# Patient Record
Sex: Female | Born: 1980 | Race: White | Hispanic: No | Marital: Married | Smoking: Never smoker
Health system: Southern US, Community
[De-identification: ages and names within clinical notes are randomized; demographics above are authoritative.]

## PROBLEM LIST (undated history)

## (undated) DIAGNOSIS — F909 Attention-deficit hyperactivity disorder, unspecified type: Secondary | ICD-10-CM

## (undated) HISTORY — DX: Attention-deficit hyperactivity disorder, unspecified type: F90.9

## (undated) HISTORY — PX: TONSILLECTOMY AND ADENOIDECTOMY: SUR1326

## (undated) HISTORY — PX: HERNIA REPAIR: SHX51

---

## 2009-01-06 ENCOUNTER — Encounter: Payer: Self-pay | Admitting: Obstetrics and Gynecology

## 2009-07-03 DIAGNOSIS — O1420 HELLP syndrome (HELLP), unspecified trimester: Secondary | ICD-10-CM

## 2014-04-18 DIAGNOSIS — Z7185 Encounter for immunization safety counseling: Secondary | ICD-10-CM | POA: Insufficient documentation

## 2014-04-18 DIAGNOSIS — Z659 Problem related to unspecified psychosocial circumstances: Secondary | ICD-10-CM | POA: Insufficient documentation

## 2014-04-18 DIAGNOSIS — O34219 Maternal care for unspecified type scar from previous cesarean delivery: Secondary | ICD-10-CM | POA: Insufficient documentation

## 2014-04-18 DIAGNOSIS — O99820 Streptococcus B carrier state complicating pregnancy: Secondary | ICD-10-CM | POA: Insufficient documentation

## 2014-04-18 DIAGNOSIS — IMO0001 Reserved for inherently not codable concepts without codable children: Secondary | ICD-10-CM | POA: Insufficient documentation

## 2014-04-27 DIAGNOSIS — O34219 Maternal care for unspecified type scar from previous cesarean delivery: Secondary | ICD-10-CM | POA: Insufficient documentation

## 2017-03-12 ENCOUNTER — Encounter: Payer: Self-pay | Admitting: Emergency Medicine

## 2017-03-12 ENCOUNTER — Emergency Department
Admission: EM | Admit: 2017-03-12 | Discharge: 2017-03-12 | Disposition: A | Discharge status: Home / Self Care | Payer: Medicaid Other | Attending: Emergency Medicine | Admitting: Emergency Medicine

## 2017-03-12 DIAGNOSIS — Z23 Encounter for immunization: Secondary | ICD-10-CM | POA: Diagnosis not present

## 2017-03-12 DIAGNOSIS — Y999 Unspecified external cause status: Secondary | ICD-10-CM | POA: Diagnosis not present

## 2017-03-12 DIAGNOSIS — Y929 Unspecified place or not applicable: Secondary | ICD-10-CM | POA: Insufficient documentation

## 2017-03-12 DIAGNOSIS — Y93E5 Activity, floor mopping and cleaning: Secondary | ICD-10-CM | POA: Diagnosis not present

## 2017-03-12 DIAGNOSIS — W268XXA Contact with other sharp object(s), not elsewhere classified, initial encounter: Secondary | ICD-10-CM | POA: Insufficient documentation

## 2017-03-12 DIAGNOSIS — S81811A Laceration without foreign body, right lower leg, initial encounter: Secondary | ICD-10-CM | POA: Insufficient documentation

## 2017-03-12 MED ORDER — TETANUS-DIPHTH-ACELL PERTUSSIS 5-2.5-18.5 LF-MCG/0.5 IM SUSP
0.5000 mL | Freq: Once | INTRAMUSCULAR | Status: AC
  Administered 2017-03-12: 0.5 mL via INTRAMUSCULAR
  Filled 2017-03-12: qty 0.5

## 2017-03-12 NOTE — ED Provider Notes (Signed)
[facility] Emergency Department Provider Note  ____________________________________________  Time seen: Approximately 4:18 PM  I have reviewed the triage vital signs and the nursing notes.   HISTORY  Chief Complaint Laceration    HPI Marilyn Norris is a 36 y.o. female who presents emergency department complaining of a laceration to her left leg. Patient reports that she was taking out a bag of garbage when a broken piece of pottery cut her right leg. The patient was able control bleeding with immediate direct pressure. She is unsure of her last tetanus shot. No other injury or complaint.   History reviewed. No pertinent past medical history.  There are no active problems to display for this patient.   Past Surgical History:  Procedure Laterality Date  . CESAREAN SECTION    . HERNIA REPAIR      Prior to Admission medications   Not on File    Allergies Morphine and related  No family history on file.  Social History Social History  Substance Use Topics  . Smoking status: Never Smoker  . Smokeless tobacco: Not on file  . Alcohol use Not on file     Review of Systems  Constitutional: No fever/chills Cardiovascular: no chest pain. Respiratory: no cough. No SOB. Musculoskeletal: Negative for musculoskeletal pain. Skin: Positive for laceration to the right shin. Neurological: Negative for headaches, focal weakness or numbness. 10-point ROS otherwise negative.  ____________________________________________   PHYSICAL EXAM:  VITAL SIGNS: ED Triage Vitals  Enc Vitals Group     BP 03/12/17 1606 113/69     Pulse Rate 03/12/17 1606 97     Resp 03/12/17 1606 18     Temp 03/12/17 1606 97.9 F (36.6 C)     Temp Source 03/12/17 1606 Oral     SpO2 03/12/17 1606 99 %     Weight 03/12/17 1608 150 lb (68 kg)     Height 03/12/17 1608 5\' 4"  (1.626 m)     Head Circumference --      Peak Flow --      Pain Score 03/12/17 1605 2     Pain Loc  --      Pain Edu? --      Excl. in [place]? --      Constitutional: Alert and oriented. Well appearing and in no acute distress. Eyes: Conjunctivae are normal. PERRL. EOMI. Head: Atraumatic. Neck: No stridor.    Cardiovascular: Normal rate, regular rhythm. Normal S1 and S2.  Good peripheral circulation. Respiratory: Normal respiratory effort without tachypnea or retractions. Lungs CTAB. Good air entry to the bases with no decreased or absent breath sounds. Musculoskeletal: Full range of motion to all extremities. No gross deformities appreciated. Neurologic:  Normal speech and language. No gross focal neurologic deficits are appreciated.  Skin:  Skin is warm, dry and intact. No rash noted. 8 cm laceration horizontally across to right lower leg. Laceration just through the epidermal layer. Edges are smooth in nature. No foreign body. No bleeding. Psychiatric: Mood and affect are normal. Speech and behavior are normal. Patient exhibits appropriate insight and judgement.   ____________________________________________   LABS (all labs ordered are listed, but only abnormal results are displayed)  Labs Reviewed - No data to display ____________________________________________  EKG   ____________________________________________  RADIOLOGY   No results found.  ____________________________________________    PROCEDURES  Procedure(s) performed:    Marland KitchenMarland KitchenLaceration Repair Date/Time: 03/12/2017 4:46 PM Performed by: Betha Loa D Authorized by: Betha Loa D   Consent:  Consent obtained:  Verbal   Consent given by:  Patient   Risks discussed:  Pain Anesthesia (see MAR for exact dosages):    Anesthesia method:  None Laceration details:    Location:  Leg   Leg location:  R lower leg   Length (cm):  8   Depth (mm):  2 Repair type:    Repair type:  Simple Exploration:    Hemostasis achieved with:  Direct pressure   Wound exploration: entire depth of wound  probed and visualized     Wound extent: no foreign bodies/material noted     Contaminated: no   Treatment:    Area cleansed with:  Shur-Clens   Amount of cleaning:  Standard Skin repair:    Repair method:  Steri-Strips and tissue adhesive   Number of Steri-Strips:  9 Approximation:    Approximation:  Close Post-procedure details:    Dressing:  Open (no dressing)   Patient tolerance of procedure:  Tolerated well, no immediate complications Comments:     Laceration was well approximated, sealed with Dermabond. Steri-Strips are placed over the laceration for further stabilization.      Medications  Tdap (BOOSTRIX) injection 0.5 mL (0.5 mLs Intramuscular Given 03/12/17 1644)     ____________________________________________   INITIAL IMPRESSION / ASSESSMENT AND PLAN / ED COURSE  Pertinent labs & imaging results that were available during my care of the patient were reviewed by me and considered in my medical decision making (see chart for details).  Review of the [place] CSRS was performed in accordance of the [place] prior to dispensing any controlled drugs.     Patient's diagnosis is consistent with leg laceration. This is closed as described above. No complications. Patient is given wound care structures. No prescriptions at this time. Patient will follow up with primary care as needed..  Patient is given ED precautions to return to the ED for any worsening or new symptoms.     ____________________________________________  FINAL CLINICAL IMPRESSION(S) / ED DIAGNOSES  Final diagnoses:  Laceration of right lower extremity, initial encounter      NEW MEDICATIONS STARTED DURING THIS VISIT:  New Prescriptions   No medications on file        This chart was dictated using voice recognition software/Dragon. Despite best efforts to proofread, errors can occur which can change the meaning. Any change was purely unintentional.    Darletta Moll, PA-C 03/12/17  1648    Carrie Mew, MD 03/14/17 [phone]

## 2017-03-12 NOTE — ED Notes (Signed)
Wound glued and steri strips placed by PA with RN assistance.

## 2017-03-12 NOTE — ED Triage Notes (Signed)
States was picking up a bag of garbage and something cut her. Laceration across R shin noted with no bleeding currently, gauze applied.

## 2017-09-12 ENCOUNTER — Ambulatory Visit (INDEPENDENT_AMBULATORY_CARE_PROVIDER_SITE_OTHER): Payer: BC Managed Care – PPO | Admitting: Obstetrics and Gynecology

## 2017-09-12 ENCOUNTER — Encounter: Payer: Self-pay | Admitting: Obstetrics and Gynecology

## 2017-09-12 VITALS — BP 110/70 | HR 100 | Ht 64.0 in | Wt 157.0 lb

## 2017-09-12 DIAGNOSIS — Z1339 Encounter for screening examination for other mental health and behavioral disorders: Secondary | ICD-10-CM

## 2017-09-12 DIAGNOSIS — Z1331 Encounter for screening for depression: Secondary | ICD-10-CM | POA: Diagnosis not present

## 2017-09-12 DIAGNOSIS — Z01419 Encounter for gynecological examination (general) (routine) without abnormal findings: Secondary | ICD-10-CM | POA: Diagnosis not present

## 2017-09-12 DIAGNOSIS — Z1329 Encounter for screening for other suspected endocrine disorder: Secondary | ICD-10-CM

## 2017-09-12 DIAGNOSIS — Z124 Encounter for screening for malignant neoplasm of cervix: Secondary | ICD-10-CM | POA: Diagnosis not present

## 2017-09-12 DIAGNOSIS — Z30430 Encounter for insertion of intrauterine contraceptive device: Secondary | ICD-10-CM | POA: Diagnosis not present

## 2017-09-12 MED ORDER — LEVONORGESTREL 19.5 MG IU IUD: 1.0000 | INTRAUTERINE_SYSTEM | Freq: Once | INTRAUTERINE | 0 refills | Status: AC

## 2017-09-12 NOTE — Progress Notes (Signed)
Gynecology Annual Exam   PCP: Patient, No Pcp Per  Chief Complaint  Patient presents with  . Gynecologic Exam   History of Present Illness:  Ms. Marilyn Norris is a 36 y.o. [phone] who LMP was Patient's last menstrual period was 09/03/2017., presents today for her annual examination.  Her menses are regular every 28-30 days, lasting 7 day(s).  Dysmenorrhea mild, occurring first 1-2 days of flow. She does not have intermenstrual bleeding.  She is single partner, contraception - condoms most of the time.  Last Pap: unknown Hx of STDs: none  Last mammogram: n/a There is no FH of breast cancer. There is no FH of ovarian cancer. The patient does not do self-breast exams.  Tobacco use: The patient denies current or previous tobacco use. Alcohol use: social drinker Exercise: not active  The patient wears seatbelts: yes.      Patient is a 36 y.o. [id number] presenting for contraception consult.  She is currently on none and desiring to start IUD.  She has a past medical history significant for no contraindications to estrogen/progesterone contraception.  She specifically denies a history of migraine with aura, chronic hypertension, history of DVT/PE and smoking.  Reported Patient's last menstrual period was 09/03/2017.Marland Kitchen     Past Medical History: Denies  Past Surgical History:  Procedure Laterality Date  . CESAREAN SECTION    . HERNIA REPAIR      Medications   Denies    Allergies  Allergen Reactions  . Morphine And Related Itching    Gynecologic History: Patient's last menstrual period was 09/03/2017.  Obstetric History: [id number]  Social History   Socioeconomic History  . Marital status: Legally Separated    Spouse name: Not on file  . Number of children: Not on file  . Years of education: Not on file  . Highest education level: Not on file  Social Needs  . Financial resource strain: Not on file  . Food insecurity - worry: Not on file  . Food insecurity -  inability: Not on file  . Transportation needs - medical: Not on file  . Transportation needs - non-medical: Not on file  Occupational History  . Not on file  Tobacco Use  . Smoking status: Never Smoker  . Smokeless tobacco: Never Used  Substance and Sexual Activity  . Alcohol use: Yes  . Drug use: No  . Sexual activity: Yes    Birth control/protection: None  Other Topics Concern  . Not on file  Social History Narrative  . Not on file    Family History  Problem Relation Age of Onset  . Thyroid disease Mother   . Cancer Father   . Thyroid disease Sister     Review of Systems  Constitutional: Negative.   HENT: Negative.   Eyes: Negative.   Respiratory: Negative.   Cardiovascular: Negative.   Gastrointestinal: Negative.   Genitourinary: Negative.   Musculoskeletal: Negative.   Skin: Negative.   Neurological: Negative.   Psychiatric/Behavioral: Negative.      Physical Exam BP 110/70   Pulse 100   Ht 5\' 4"  (1.626 m)   Wt 157 lb (71.2 kg)   LMP 09/03/2017   BMI 26.95 kg/m    Physical Exam  Constitutional: She is oriented to person, place, and time. She appears well-developed and well-nourished. No distress.  Genitourinary: Uterus normal. Pelvic exam was performed with patient supine. There is no rash, tenderness, lesion or injury on the right labia. There is no rash, tenderness,  lesion or injury on the left labia. No erythema, tenderness or bleeding in the vagina. No signs of injury around the vagina. No vaginal discharge found. Right adnexum does not display mass, does not display tenderness and does not display fullness. Left adnexum does not display mass, does not display tenderness and does not display fullness. Cervix does not exhibit motion tenderness, lesion, discharge or polyp.   Uterus is mobile and anteverted. Uterus is not enlarged, tender or exhibiting a mass.  HENT:  Head: Normocephalic and atraumatic.  Eyes: EOM are normal. No scleral icterus.  Neck:  Normal range of motion. Neck supple. No thyromegaly present.  Cardiovascular: Normal rate and regular rhythm. Exam reveals no gallop and no friction rub.  No murmur heard. Pulmonary/Chest: Effort normal and breath sounds normal. No respiratory distress. She has no wheezes. She has no rales. Right breast exhibits no inverted nipple, no mass, no nipple discharge, no skin change and no tenderness. Left breast exhibits no inverted nipple, no mass, no nipple discharge, no skin change and no tenderness.  Abdominal: Soft. Bowel sounds are normal. She exhibits no distension and no mass. There is no tenderness. There is no rebound and no guarding.  Musculoskeletal: Normal range of motion. She exhibits no edema or tenderness.  Lymphadenopathy:    She has no cervical adenopathy.       Right: No inguinal adenopathy present.       Left: No inguinal adenopathy present.  Neurological: She is alert and oriented to person, place, and time. No cranial nerve deficit.  Skin: Skin is warm and dry. No rash noted. No erythema.  Psychiatric: She has a normal mood and affect. Her behavior is normal. Judgment normal.   IUD Insertion Procedure Note Marilyn Norris) Patient identified, informed consent performed, consent signed.   Discussed risks of irregular bleeding, cramping, infection, malpositioning, expulsion or uterine perforation of the IUD (1:1000 placements)  which may require further procedure such as laparoscopy.  IUD while effective at preventing pregnancy do not prevent transmission of sexually transmitted diseases and use of barrier methods for this purpose was discussed. Time out was performed.  Urine pregnancy test negative.  Speculum placed in the vagina.  Cervix visualized.  Cleaned with Betadine x 2.  Grasped anteriorly with a single tooth tenaculum.  Uterus sounded to 8 cm. IUD placed per manufacturer's recommendations.  Strings trimmed to 3 cm. Tenaculum was removed, good hemostasis noted.  Patient tolerated  procedure well.   Patient was given post-procedure instructions.  She was advised to have backup contraception for one week.    Female chaperone present for pelvic and breast  portions of the physical exam  Results: AUDIT Questionnaire (screen for alcoholism): 3 PHQ-9: 0  Assessment: 36 y.o. G44P3003 female here for routine annual gynecologic examination.  Plan: Problem List Items Addressed This Visit    None    Visit Diagnoses    Women's annual routine gynecological examination    -  Primary   Relevant Orders   TSH + free T4   IGP, Aptima HPV, rfx 16/18,45   Screening for depression       Screening for alcoholism       Pap smear for cervical cancer screening       Relevant Orders   IGP, Aptima HPV, rfx 16/18,45   Encounter for insertion of intrauterine contraceptive device (IUD)       Relevant Medications   Levonorgestrel (KYLEENA) 19.5 MG IUD   Screening for thyroid disorder  Relevant Orders   TSH + free T4      Screening: -- Blood pressure screen normal -- Colonoscopy - not due -- Mammogram - not due -- Weight screening: overweight: continue to monitor -- Depression screening negative (PHQ-9) -- Nutrition: normal -- cholesterol screening: not due for screening -- osteoporosis screening: not due -- tobacco screening: not using -- alcohol screening: AUDIT questionnaire indicates low-risk usage. -- family history of breast cancer screening: done. not at high risk. -- no evidence of domestic violence or intimate partner violence. -- STD screening: gonorrhea/chlamydia NAAT not collected per patient request. -- pap smear collected per ASCCP guidelines -- HPV vaccination series: not eligilbe    Prentice Docker, MD 09/12/2017 11:02 AM

## 2017-09-12 NOTE — Patient Instructions (Signed)

## 2017-09-13 LAB — TSH+FREE T4
Free T4: 1.46 ng/dL (ref 0.82–1.77)
TSH: 1.07 u[IU]/mL (ref 0.450–4.500)

## 2017-09-16 LAB — IGP, APTIMA HPV, RFX 16/18,45
HPV APTIMA: NEGATIVE
PAP SMEAR COMMENT: 0

## 2017-10-11 ENCOUNTER — Ambulatory Visit: Payer: BC Managed Care – PPO | Admitting: Obstetrics and Gynecology

## 2018-04-12 DIAGNOSIS — D239 Other benign neoplasm of skin, unspecified: Secondary | ICD-10-CM

## 2018-04-12 HISTORY — DX: Other benign neoplasm of skin, unspecified: D23.9

## 2018-10-13 ENCOUNTER — Encounter: Payer: Self-pay | Admitting: Obstetrics and Gynecology

## 2018-10-13 ENCOUNTER — Ambulatory Visit (INDEPENDENT_AMBULATORY_CARE_PROVIDER_SITE_OTHER): Payer: BC Managed Care – PPO | Admitting: Obstetrics and Gynecology

## 2018-10-13 VITALS — BP 100/50 | HR 82 | Ht 64.0 in | Wt 164.0 lb

## 2018-10-13 DIAGNOSIS — F900 Attention-deficit hyperactivity disorder, predominantly inattentive type: Secondary | ICD-10-CM

## 2018-10-13 DIAGNOSIS — Z01419 Encounter for gynecological examination (general) (routine) without abnormal findings: Secondary | ICD-10-CM | POA: Diagnosis not present

## 2018-10-13 DIAGNOSIS — Z1329 Encounter for screening for other suspected endocrine disorder: Secondary | ICD-10-CM

## 2018-10-13 DIAGNOSIS — Z130 Encounter for screening for diseases of the blood and blood-forming organs and certain disorders involving the immune mechanism: Secondary | ICD-10-CM

## 2018-10-13 DIAGNOSIS — N393 Stress incontinence (female) (male): Secondary | ICD-10-CM

## 2018-10-13 DIAGNOSIS — Z0000 Encounter for general adult medical examination without abnormal findings: Secondary | ICD-10-CM

## 2018-10-13 DIAGNOSIS — Z131 Encounter for screening for diabetes mellitus: Secondary | ICD-10-CM

## 2018-10-13 DIAGNOSIS — Z13220 Encounter for screening for lipoid disorders: Secondary | ICD-10-CM

## 2018-10-13 NOTE — Progress Notes (Signed)
Gynecology Annual Exam   PCP: Patient, No Pcp Per  Chief Complaint:  Chief Complaint  Patient presents with  . Gynecologic Exam    Urinary incontance, and ADHD    History of Present Illness: Patient is a 38 y.o. G3P3003 presents for annual exam. The patient has no complaints today.   LMP: No LMP recorded. (Menstrual status: IUD). Average Interval: regular, 28 days Duration of flow: a few days Heavy Menses: no Clots: no Intermenstrual Bleeding: no Postcoital Bleeding: no Dysmenorrhea: no  The patient is sexually active. She currently uses IUD for contraception. She denies dyspareunia.  The patient does perform self breast exams.  There is no notable family history of breast or ovarian cancer in her family.  The patient wears seatbelts: yes.   The patient has regular exercise: yes.    The patient denies current symptoms of depression.    Review of Systems: Review of Systems  Constitutional: Negative for chills, fever, malaise/fatigue and weight loss.  HENT: Negative for congestion, hearing loss and sinus pain.   Eyes: Negative for blurred vision and double vision.  Respiratory: Negative for cough, sputum production, shortness of breath and wheezing.   Cardiovascular: Negative for chest pain, palpitations, orthopnea and leg swelling.  Gastrointestinal: Negative for abdominal pain, constipation, diarrhea, nausea and vomiting.  Genitourinary: Negative for dysuria, flank pain, frequency, hematuria and urgency.  Musculoskeletal: Negative for back pain, falls and joint pain.  Skin: Negative for itching and rash.  Neurological: Negative for dizziness and headaches.  Psychiatric/Behavioral: Negative for depression, substance abuse and suicidal ideas. The patient is not nervous/anxious.     Past Medical History:  Past Medical History:  Diagnosis Date  . Adult ADHD    ADD    Past Surgical History:  Past Surgical History:  Procedure Laterality Date  . CESAREAN SECTION   07/03/2009  . HERNIA REPAIR    . TONSILLECTOMY AND ADENOIDECTOMY     17-73 years old     Gynecologic History:  No LMP recorded. (Menstrual status: IUD). Contraception: IUD Last Pap: Results were: NIL and HR HPV negative   Obstetric History: [id number]  Family History:  Family History  Problem Relation Age of Onset  . Thyroid disease Mother   . Cancer Father        Kidney   . Thyroid disease Sister     Social History:  Social History   Socioeconomic History  . Marital status: Divorced    Spouse name: Not on file  . Number of children: Not on file  . Years of education: Not on file  . Highest education level: Not on file  Occupational History  . Not on file  Social Needs  . Financial resource strain: Not on file  . Food insecurity:    Worry: Not on file    Inability: Not on file  . Transportation needs:    Medical: Not on file    Non-medical: Not on file  Tobacco Use  . Smoking status: Never Smoker  . Smokeless tobacco: Never Used  Substance and Sexual Activity  . Alcohol use: Yes  . Drug use: No  . Sexual activity: Yes    Birth control/protection: I.U.D.  Lifestyle  . Physical activity:    Days per week: Not on file    Minutes per session: Not on file  . Stress: Not on file  Relationships  . Social connections:    Talks on phone: Not on file    Gets together: Not on  file    Attends religious service: Not on file    Active member of club or organization: Not on file    Attends meetings of clubs or organizations: Not on file    Relationship status: Not on file  . Intimate partner violence:    Fear of current or ex partner: Not on file    Emotionally abused: Not on file    Physically abused: Not on file    Forced sexual activity: Not on file  Other Topics Concern  . Not on file  Social History Narrative  . Not on file    Allergies:  Allergies  Allergen Reactions  . Morphine And Related Itching    Medications: Prior to Admission medications     Medication Sig Start Date End Date Taking? Authorizing Provider  Levonorgestrel (KYLEENA) 19.5 MG IUD 1 Device by Intrauterine route once for 1 dose. 09/12/17 09/12/17  Will Bonnet, MD    Physical Exam Vitals: Blood pressure (!) 100/50, pulse 82, height 5\' 4"  (1.626 m), weight 164 lb (74.4 kg).  General: NAD HEENT: normocephalic, anicteric Thyroid: no enlargement, no palpable nodules Pulmonary: No increased work of breathing, CTAB Cardiovascular: RRR, distal pulses 2+ Breast: Breast symmetrical, no tenderness, no palpable nodules or masses, no skin or nipple retraction present, no nipple discharge.  No axillary or supraclavicular lymphadenopathy. Abdomen: NABS, soft, non-tender, non-distended.  Umbilicus without lesions.  No hepatomegaly, splenomegaly or masses palpable. No evidence of hernia  Genitourinary:  External: Normal external female genitalia.  Normal urethral meatus, normal Bartholin's and Skene's glands.    Vagina: Normal vaginal mucosa, no evidence of prolapse.    Cervix: Grossly normal in appearance, no bleeding- IUD strings seen.  Uterus: Non-enlarged, mobile, normal contour.  No CMT  Adnexa: ovaries non-enlarged, no adnexal masses  Rectal: deferred  Lymphatic: no evidence of inguinal lymphadenopathy Extremities: no edema, erythema, or tenderness Neurologic: Grossly intact Psychiatric: mood appropriate, affect full  Female chaperone present for pelvic and breast  portions of the physical exam    Assessment: 38 y.o. G3P3003 routine annual exam  Plan: Problem List Items Addressed This Visit    None    Visit Diagnoses    Health care maintenance    -  Primary   Relevant Orders   Lipid panel   CBC   Basic Metabolic Panel (BMET)   TSH   CBC   TSH   Lipid panel   Basic Metabolic Panel (BMET)   Thyroid disorder screening       Relevant Orders   TSH   TSH   Screening for diabetes mellitus       Relevant Orders   Basic Metabolic Panel (BMET)   Basic  Metabolic Panel (BMET)   Screening cholesterol level       Relevant Orders   Lipid panel   Lipid panel   Screening, anemia, deficiency, iron       Relevant Orders   CBC   CBC   Attention deficit hyperactivity disorder (ADHD), predominantly inattentive type       Relevant Orders   Ambulatory referral to Psychiatry      2) STI screening  was  offered and accepted  2)  ASCCP guidelines and rational discussed.  Patient opts for every 5 years screening interval  3) Contraception - the patient is currently using  IUD.  She is happy with her current form of contraception and plans to continue  4) Routine healthcare maintenance including cholesterol, diabetes screening discussed To return  fasting at a later date  5) ADD- reports that she was prescribed a medication for her for this after she had testing. She does not know the name. Referral made to psychiatry for prescription maintenance   6) Stress incontinence- patient reports significant cough laugh sneeze leakage of urine. She has to wear a tampon when she jogs to prevent leakage of urine. She usually wears dark clothes to prevent visible accidents. Has to change her underwear on a daily basis because of an accident.  We discussed options for treatment including surgery, pelvic floor physical therapy, incontinence tampons and pessary with a knob.   7) Return in about 1 year (around 10/14/2019) for annual.  More than 45 minutes were spent face to face with the patient in the room with more than 50% of the time spent providing counseling and discussing the plan of management. We discussed management of her ADD, referrals to appropriate providers, stress incontinence care and management.    Adrian Prows MD [facility] 10/13/2018 5:09 PM

## 2018-10-16 ENCOUNTER — Ambulatory Visit (INDEPENDENT_AMBULATORY_CARE_PROVIDER_SITE_OTHER): Payer: BC Managed Care – PPO | Admitting: Maternal Newborn

## 2018-10-16 ENCOUNTER — Encounter: Payer: Self-pay | Admitting: Maternal Newborn

## 2018-10-16 VITALS — BP 100/62 | HR 75 | Ht 64.0 in | Wt 163.0 lb

## 2018-10-16 DIAGNOSIS — R82998 Other abnormal findings in urine: Secondary | ICD-10-CM | POA: Diagnosis not present

## 2018-10-16 DIAGNOSIS — R309 Painful micturition, unspecified: Secondary | ICD-10-CM | POA: Diagnosis not present

## 2018-10-16 DIAGNOSIS — N390 Urinary tract infection, site not specified: Secondary | ICD-10-CM | POA: Diagnosis not present

## 2018-10-16 DIAGNOSIS — R300 Dysuria: Secondary | ICD-10-CM

## 2018-10-16 DIAGNOSIS — R319 Hematuria, unspecified: Secondary | ICD-10-CM

## 2018-10-16 LAB — POCT URINALYSIS DIPSTICK
BILIRUBIN UA: NEGATIVE
Glucose, UA: NEGATIVE
Ketones, UA: NEGATIVE
Nitrite, UA: NEGATIVE
Protein, UA: NEGATIVE
Spec Grav, UA: 1.015 (ref 1.010–1.025)
Urobilinogen, UA: 0.2 E.U./dL
pH, UA: 6 (ref 5.0–8.0)

## 2018-10-16 MED ORDER — NITROFURANTOIN MONOHYD MACRO 100 MG PO CAPS: 100.0000 mg | ORAL_CAPSULE | Freq: Two times a day (BID) | ORAL | 1 refills | Status: DC

## 2018-10-16 NOTE — Progress Notes (Signed)
Obstetrics & Gynecology Office Visit   Chief Complaint:  Chief Complaint  Patient presents with  . Urinary Tract Infection    Burning with urination, sharp pain with urination, frequency     History of Present Illness: Marilyn Norris reports very painful dysuria starting yesterday evening. She drank a lot of water overnight and felt a small improvement in symptoms. Has not been able to drink as many fluids today, and dysuria continues along with frequency.  Review of Systems: Review of systems negative unless otherwise noted in HPI.  Past Medical History:  Past Medical History:  Diagnosis Date  . Adult ADHD    ADD    Past Surgical History:  Past Surgical History:  Procedure Laterality Date  . CESAREAN SECTION  07/03/2009  . HERNIA REPAIR    . TONSILLECTOMY AND ADENOIDECTOMY     37-40 years old     Gynecologic History: No LMP recorded. (Menstrual status: IUD).  Obstetric History: [id number]  Family History:  Family History  Problem Relation Age of Onset  . Thyroid disease Mother   . Cancer Father        Kidney   . Thyroid disease Sister     Social History:  Social History   Socioeconomic History  . Marital status: Divorced    Spouse name: Not on file  . Number of children: Not on file  . Years of education: Not on file  . Highest education level: Not on file  Occupational History  . Not on file  Social Needs  . Financial resource strain: Not on file  . Food insecurity:    Worry: Not on file    Inability: Not on file  . Transportation needs:    Medical: Not on file    Non-medical: Not on file  Tobacco Use  . Smoking status: Never Smoker  . Smokeless tobacco: Never Used  Substance and Sexual Activity  . Alcohol use: Yes  . Drug use: No  . Sexual activity: Yes    Birth control/protection: I.U.D.  Lifestyle  . Physical activity:    Days per week: Not on file    Minutes per session: Not on file  . Stress: Not on file  Relationships  . Social connections:     Talks on phone: Not on file    Gets together: Not on file    Attends religious service: Not on file    Active member of club or organization: Not on file    Attends meetings of clubs or organizations: Not on file    Relationship status: Not on file  . Intimate partner violence:    Fear of current or ex partner: Not on file    Emotionally abused: Not on file    Physically abused: Not on file    Forced sexual activity: Not on file  Other Topics Concern  . Not on file  Social History Narrative  . Not on file    Allergies:  Allergies  Allergen Reactions  . Morphine And Related Itching    Medications: Prior to Admission medications   Medication Sig Start Date End Date Taking? Authorizing Provider  Levonorgestrel (KYLEENA) 19.5 MG IUD 1 Device by Intrauterine route once for 1 dose. 09/12/17 09/12/17  Will Bonnet, MD    Physical Exam Vitals:  Vitals:   10/16/18 1411  BP: 100/62  Pulse: 75   No LMP recorded. (Menstrual status: IUD).  General: NAD HEENT: normocephalic, anicteric Pulmonary: No increased work of breathing Neurologic: Grossly intact  Psychiatric: mood appropriate, affect full  UA shows +3 leukocytes  Assessment: 38 y.o. G3P3003 with a urinary tract infection.  Plan: Problem List Items Addressed This Visit    None    Visit Diagnoses    Painful urination    -  Primary   Dysuria       Relevant Orders   POCT Urinalysis Dipstick (Completed)   Urine Culture   Leukocytes in urine       Relevant Orders   Urine Culture   Urinary tract infection with hematuria, site unspecified       Relevant Medications   nitrofurantoin, macrocrystal-monohydrate, (MACROBID) 100 MG capsule     Begin empiric antibiotic treatment with nitrofurantoin due to symptoms/UA results. Will notify her if urine culture results necessitate change in therapy.  Avel Sensor, CNM 10/16/2018

## 2018-10-16 NOTE — Telephone Encounter (Signed)
Pt also called nurse line c/o sxs of UTI.  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  She is asking for an rx to be sent in.  [phone]

## 2018-10-18 ENCOUNTER — Encounter: Payer: Self-pay | Admitting: Maternal Newborn

## 2018-10-18 LAB — URINE CULTURE

## 2019-11-23 ENCOUNTER — Ambulatory Visit: Payer: BC Managed Care – PPO | Attending: Internal Medicine

## 2019-11-23 ENCOUNTER — Other Ambulatory Visit: Payer: Self-pay

## 2019-11-23 DIAGNOSIS — Z23 Encounter for immunization: Secondary | ICD-10-CM

## 2019-11-23 NOTE — Progress Notes (Signed)
   Covid-19 Vaccination Clinic  Name:  Marilyn Norris    MRN: [id number] DOB: [birth year]  11/23/2019  [name] was observed post Covid-19 immunization for 15 minutes without incident. She was provided with Vaccine Information Sheet and instruction to access the V-Safe system.   [name] was instructed to call 911 with any severe reactions post vaccine: Marland Kitchen Difficulty breathing  . Swelling of face and throat  . A fast heartbeat  . A bad rash all over body  . Dizziness and weakness   Immunizations Administered    Name Date Dose VIS Date Route   Pfizer COVID-19 Vaccine 11/23/2019 11:27 AM 0.3 mL 08/31/2019 Intramuscular   Manufacturer: [place]   Lot: [id number]   [place]: [id number]

## 2019-12-14 ENCOUNTER — Ambulatory Visit: Payer: BC Managed Care – PPO | Attending: Internal Medicine

## 2019-12-14 DIAGNOSIS — Z23 Encounter for immunization: Secondary | ICD-10-CM

## 2019-12-14 NOTE — Progress Notes (Signed)
   Covid-19 Vaccination Clinic  Name:  Marilyn Norris    MRN: [id number] DOB: Aug 16, 1981  12/14/2019  [name] was observed post Covid-19 immunization for 15 minutes without incident. She was provided with Vaccine Information Sheet and instruction to access the V-Safe system.   [name] was instructed to call 911 with any severe reactions post vaccine: Marland Kitchen Difficulty breathing  . Swelling of face and throat  . A fast heartbeat  . A bad rash all over body  . Dizziness and weakness   Immunizations Administered    Name Date Dose VIS Date Route   Pfizer COVID-19 Vaccine 12/14/2019 11:22 AM 0.3 mL 08/31/2019 Intramuscular   Manufacturer: [place]   Lot: [id number]   [place]: [id number]

## 2020-04-11 ENCOUNTER — Telehealth: Payer: Self-pay

## 2020-04-11 NOTE — Telephone Encounter (Signed)
Pt needs for you to renew the Psychiatry referral

## 2020-04-14 ENCOUNTER — Other Ambulatory Visit: Payer: Self-pay | Admitting: Obstetrics and Gynecology

## 2020-04-14 DIAGNOSIS — F900 Attention-deficit hyperactivity disorder, predominantly inattentive type: Secondary | ICD-10-CM

## 2020-04-14 NOTE — Telephone Encounter (Signed)
Can you help with her referral being processed? Looks like it's been a 18 months since I first placed it?? I can place again I guess.  Thank you,  Dr. Gilman Schmidt

## 2020-04-15 NOTE — Telephone Encounter (Signed)
Patient is schedule for annual 05/29/20 with [facility]

## 2020-04-15 NOTE — Telephone Encounter (Signed)
Her last annual was Jan 2020. Do you want me to schedule her for that too? I will need you to place a new referral too.

## 2020-04-15 NOTE — Telephone Encounter (Signed)
Pt requested a referral but has not been seen since 09/2018. Please contact and schedule for annual gyn visit.

## 2020-04-15 NOTE — Telephone Encounter (Signed)
That would be great. I ordered the referral yesterday, I think you should be able to see it. Thank you

## 2020-05-29 ENCOUNTER — Ambulatory Visit (INDEPENDENT_AMBULATORY_CARE_PROVIDER_SITE_OTHER): Payer: BC Managed Care – PPO | Admitting: Obstetrics and Gynecology

## 2020-05-29 ENCOUNTER — Encounter: Payer: Self-pay | Admitting: Obstetrics and Gynecology

## 2020-05-29 ENCOUNTER — Other Ambulatory Visit: Payer: Self-pay

## 2020-05-29 VITALS — BP 114/70 | HR 73 | Resp 18 | Ht 64.0 in | Wt 159.6 lb

## 2020-05-29 DIAGNOSIS — Z01419 Encounter for gynecological examination (general) (routine) without abnormal findings: Secondary | ICD-10-CM

## 2020-05-29 DIAGNOSIS — Z298 Encounter for other specified prophylactic measures: Secondary | ICD-10-CM

## 2020-05-29 DIAGNOSIS — Z130 Encounter for screening for diseases of the blood and blood-forming organs and certain disorders involving the immune mechanism: Secondary | ICD-10-CM

## 2020-05-29 DIAGNOSIS — N6325 Unspecified lump in the left breast, overlapping quadrants: Secondary | ICD-10-CM

## 2020-05-29 DIAGNOSIS — Z0000 Encounter for general adult medical examination without abnormal findings: Secondary | ICD-10-CM

## 2020-05-29 DIAGNOSIS — N6315 Unspecified lump in the right breast, overlapping quadrants: Secondary | ICD-10-CM

## 2020-05-29 DIAGNOSIS — Z1329 Encounter for screening for other suspected endocrine disorder: Secondary | ICD-10-CM

## 2020-05-29 DIAGNOSIS — Z131 Encounter for screening for diabetes mellitus: Secondary | ICD-10-CM

## 2020-05-29 DIAGNOSIS — Z13220 Encounter for screening for lipoid disorders: Secondary | ICD-10-CM

## 2020-05-29 NOTE — Progress Notes (Signed)
Gynecology Annual Exam  PCP: Marilyn Norris, No Pcp Per  Chief Complaint:  Chief Complaint  Marilyn Norris presents with  . Gynecologic Exam    History of Present Illness: Marilyn Norris is a 39 y.o. G3P3003 presents for annual exam. The Marilyn Norris has no complaints today.   LMP: No LMP recorded. (Menstrual status: IUD). Average Interval: irregular Duration of flow: 0 days Heavy Menses: no Clots: no Intermenstrual Bleeding: yes Postcoital Bleeding: yes Dysmenorrhea: no   The Marilyn Norris is sexually active. She currently uses IUD for contraception. She denies dyspareunia.  The Marilyn Norris does perform self breast exams.  There is no notable family history of breast or ovarian cancer in her family.  The Marilyn Norris wears seatbelts: no.   The Marilyn Norris has regular exercise: yes.    The Marilyn Norris denies current symptoms of depression.    Review of Systems: ROS  Past Medical History:  Marilyn Norris Active Problem List   Diagnosis Date Noted  . VBAC, delivered, current hospitalization 04/27/2014    Formatting of this note might be different from the original. 04/27/2014 VBAC of Audrey (weight 8 lbs 15 oz) over a second degree laceration.  Assisted by Max Sane, CNM.  Breastfeeding. Pt's sister is Scientist, water quality to [employer]   . Contraception 04/18/2014    Formatting of this note might be different from the original. Mirena   . Group B Streptococcus carrier, +RV culture, currently pregnant 04/18/2014    Formatting of this note might be different from the original. Adequate penicillin prophylaxis during labor--4 doses received.   Marland Kitchen History of successful vaginal birth after cesarean, currently pregnant 04/18/2014    Formatting of this note might be different from the original. Desires repeat TOLAC. Had PLTCS for breech-also HELLP in that initial pregnancy.  Had one successful VBAC.   Marland Kitchen Other social stressor 04/18/2014    Formatting of this note might be different from the original. Father's terminal illness  and recent death. Marilyn Norris has no hx of pp depression or anxiety but somewhat fearful this time due to her family circumstances. -- Should offer 2 week pp mood check if still in the area.   . Immunization counseling 04/18/2014    Formatting of this note might be different from the original. Tdap: Marilyn Norris does not remember, not documented in transferred records Rubella: immune Varicella: Had disease Flu: will recommend during fall flu season     Past Surgical History:  Past Surgical History:  Procedure Laterality Date  . CESAREAN SECTION  07/03/2009  . HERNIA REPAIR    . TONSILLECTOMY AND ADENOIDECTOMY     58-80 years old     Gynecologic History:  No LMP recorded. (Menstrual status: IUD). Contraception: IUD Last Pap: Results were: 2018 NIL   Obstetric History: G3P3003  Family History:  Family History  Problem Relation Age of Onset  . Thyroid disease Mother   . Cancer Father        Kidney   . Thyroid disease Sister     Social History:  Social History   Socioeconomic History  . Marital status: Married    Spouse name: Not on file  . Number of children: Not on file  . Years of education: Not on file  . Highest education level: Not on file  Occupational History  . Not on file  Tobacco Use  . Smoking status: Never Smoker  . Smokeless tobacco: Never Used  Vaping Use  . Vaping Use: Never used  Substance and Sexual Activity  . Alcohol use: Yes  .  Drug use: No  . Sexual activity: Yes    Birth control/protection: I.U.D.  Other Topics Concern  . Not on file  Social History Narrative  . Not on file   Social Determinants of Health   Financial Resource Strain:   . Difficulty of Paying Living Expenses: Not on file  Food Insecurity:   . Worried About Charity fundraiser in the Last Year: Not on file  . Ran Out of Food in the Last Year: Not on file  Transportation Needs:   . Lack of Transportation (Medical): Not on file  . Lack of Transportation (Non-Medical): Not  on file  Physical Activity:   . Days of Exercise per Week: Not on file  . Minutes of Exercise per Session: Not on file  Stress:   . Feeling of Stress : Not on file  Social Connections:   . Frequency of Communication with Friends and Family: Not on file  . Frequency of Social Gatherings with Friends and Family: Not on file  . Attends Religious Services: Not on file  . Active Member of Clubs or Organizations: Not on file  . Attends Archivist Meetings: Not on file  . Marital Status: Not on file  Intimate Partner Violence:   . Fear of Current or Ex-Partner: Not on file  . Emotionally Abused: Not on file  . Physically Abused: Not on file  . Sexually Abused: Not on file    Allergies:  Allergies  Allergen Reactions  . Morphine And Related Itching    Medications: Prior to Admission medications   Medication Sig Start Date End Date Taking? Authorizing Provider  Levonorgestrel (KYLEENA) 19.5 MG IUD 1 Device by Intrauterine route once for 1 dose. 09/12/17 09/12/17  Will Bonnet, MD  nitrofurantoin, macrocrystal-monohydrate, (MACROBID) 100 MG capsule Take 1 capsule (100 mg total) by mouth 2 (two) times daily. 10/16/18   Rexene Agent, CNM    Physical Exam Vitals: Blood pressure 114/70, pulse 73, resp. rate 18, height 5\' 4"  (1.626 m), weight 159 lb 9.6 oz (72.4 kg), SpO2 98 %.  General: NAD HEENT: normocephalic, anicteric Thyroid: no enlargement, no palpable nodules Pulmonary: No increased work of breathing, CTAB Cardiovascular: RRR, distal pulses 2+ Breast: Breast symmetrical, no tenderness, no palpable nodules or masses, no skin or nipple retraction present, no nipple discharge.  No axillary or supraclavicular lymphadenopathy. Abdomen: NABS, soft, non-tender, non-distended.  Umbilicus without lesions.  No hepatomegaly, splenomegaly or masses palpable. No evidence of hernia  Genitourinary:  External: Normal external female genitalia.  Normal urethral meatus, normal  Bartholin's and Skene's glands.    Vagina: Normal vaginal mucosa, no evidence of prolapse.    Cervix: Grossly normal in appearance, no bleeding  Uterus: Non-enlarged, mobile, normal contour.  No CMT  Adnexa: ovaries non-enlarged, no adnexal masses  Rectal: deferred  Lymphatic: no evidence of inguinal lymphadenopathy Extremities: no edema, erythema, or tenderness Neurologic: Grossly intact Psychiatric: mood appropriate, affect full  Female chaperone present for pelvic and breast  portions of the physical exam    Assessment: 39 y.o. G3P3003 routine annual exam  Plan: Problem List Items Addressed This Visit   None   Visit Diagnoses    Health care maintenance    -  Primary   Relevant Orders   CBC With Differential   Comprehensive metabolic panel   Lipid panel   T4, free   TSH   Breast lump on right side at 12 o'clock position       Relevant Orders  MM DIAG BREAST TOMO BILATERAL (Completed)   US BREAST LTD UNI LEFT INC AXILLA (Completed)   US BREAST LTD UNI RIGHT INC AXILLA (Completed)   Breast lump on left side at 3 o'clock position       Relevant Orders   MM DIAG BREAST TOMO BILATERAL (Completed)   US BREAST LTD UNI LEFT INC AXILLA (Completed)   US BREAST LTD UNI RIGHT INC AXILLA (Completed)   Need for prophylaxis against sexually transmitted diseases       Encounter for gynecological examination without abnormal finding       Encounter for annual routine gynecological examination       Screening for thyroid disorder       Relevant Orders   T4, free   TSH   Screening for deficiency anemia       Relevant Orders   CBC With Differential   Screening for diabetes mellitus       Relevant Orders   Comprehensive metabolic panel   Screening cholesterol level       Relevant Orders   Lipid panel      1) Mammogram - recommend yearly screening mammogram.  Breast lumps in right and left breasts, [place] and mammogram ordered  2) STI screening  wasoffered and declined  3)  ASCCP guidelines and rational discussed.  Marilyn Norris opts for every 5 years screening interval  4) Contraception - the Marilyn Norris is currently using  IUD.  She is happy with her current form of contraception and plans to continue  5)  Routine healthcare maintenance including cholesterol, diabetes screening discussed To return fasting at a later date  7) Return in about 1 year (around 05/29/2021) for annual.   [place], [facility] 05/29/2020, 4:18 PM

## 2020-06-13 ENCOUNTER — Other Ambulatory Visit: Payer: Self-pay

## 2020-06-13 ENCOUNTER — Ambulatory Visit
Admission: RE | Admit: 2020-06-13 | Discharge: 2020-06-13 | Disposition: A | Discharge status: Home / Self Care | Payer: BC Managed Care – PPO | Source: Ambulatory Visit | Attending: Obstetrics and Gynecology | Admitting: Obstetrics and Gynecology

## 2020-06-13 DIAGNOSIS — N6315 Unspecified lump in the right breast, overlapping quadrants: Secondary | ICD-10-CM

## 2020-06-13 DIAGNOSIS — N6325 Unspecified lump in the left breast, overlapping quadrants: Secondary | ICD-10-CM

## 2020-06-26 ENCOUNTER — Other Ambulatory Visit: Payer: Self-pay

## 2020-06-26 ENCOUNTER — Telehealth (INDEPENDENT_AMBULATORY_CARE_PROVIDER_SITE_OTHER): Payer: BC Managed Care – PPO | Admitting: Psychiatry

## 2020-06-26 ENCOUNTER — Encounter: Payer: Self-pay | Admitting: Psychiatry

## 2020-06-26 DIAGNOSIS — Z8659 Personal history of other mental and behavioral disorders: Secondary | ICD-10-CM | POA: Diagnosis not present

## 2020-06-26 DIAGNOSIS — F900 Attention-deficit hyperactivity disorder, predominantly inattentive type: Secondary | ICD-10-CM | POA: Diagnosis not present

## 2020-06-26 MED ORDER — METHYLPHENIDATE HCL ER (OSM) 36 MG PO TBCR: 36.0000 mg | EXTENDED_RELEASE_TABLET | Freq: Every day | ORAL | 0 refills | Status: DC

## 2020-06-26 NOTE — Progress Notes (Signed)
Provider Location : [facility] Patient Location : Work  Participants: Patient , Provider  Virtual Visit via Video Note  I connected with [name] on 06/26/20 at  3:00 PM EDT by a video enabled telemedicine application and verified that I am speaking with the correct person using two identifiers.   I discussed the limitations of evaluation and management by telemedicine and the availability of in person appointments. The patient expressed understanding and agreed to proceed.     I discussed the assessment and treatment plan with the patient. The patient was provided an opportunity to ask questions and all were answered. The patient agreed with the plan and demonstrated an understanding of the instructions.   The patient was advised to call back or seek an in-person evaluation if the symptoms worsen or if the condition fails to improve as anticipated.    Psychiatric Initial Adult Assessment   Patient Identification: Marilyn Norris MRN:  [id number] Date of Evaluation:  06/26/2020 Referral Source: Charlesetta Garibaldi MD Chief Complaint:   Chief Complaint    Establish Care     Visit Diagnosis:    ICD-10-CM   1. Attention deficit hyperactivity disorder (ADHD), predominantly inattentive type  F90.0 methylphenidate (CONCERTA) 36 MG PO CR tablet  2. History of depression  Z86.59     History of Present Illness: Marilyn Norris is a 39 year old Caucasian female, employed as a Pharmacist, hospital, married, lives in [place], has a history of ADHD inattentive type, depression, was evaluated by telemedicine today.  Patient reports she was diagnosed with ADHD in 2014.  She reports her symptoms as inability to give close attention to details, making careless mistakes, difficulty sustaining attention, difficulty with organizing, being forgetful and so on.  She reports she had ADHD testing done and was diagnosed with it.  She reports she was started on Adderall first and she had side effects to it.   Later on she was started on Concerta.  She was started on 18 mg and gradually increased to 36 mg at that time.  She however became pregnant and had to stop taking the medication.  A lot happened after that.  She reports she went through a divorce, she lost her dad and also moved from [place] where she was located at that time.  She hence did not go back on the medication.  She reports she is currently struggling with a lot of problems at work due to her ADHD symptoms.  She reports her colleagues have observed her ADHD symptoms and have brought it to her attention.  She reports she wants to get back on the medications since she is having difficulty functioning.  She reports a history of depression in the past.  She reports in 2015 she was going through a divorce, lost her dad, and also had a baby.  She reports at that time she went through an episode of depressive symptoms and was started on Lexapro which she took for 6 months or so.  She currently denies any depressive symptoms.  Patient denies any sleep problems.  Patient denies any appetite changes.  Patient denies any suicidality, homicidality or perceptual disturbances.  She denies any history of trauma.  She denies any substance abuse problems.   Associated Signs/Symptoms: Depression Symptoms:  difficulty concentrating, (Hypo) Manic Symptoms:  Denies Anxiety Symptoms:  Denies Psychotic Symptoms:  Denies PTSD Symptoms: Negative  Past Psychiatric History: Patient denies past history of mental health problems other than ADHD.  She was diagnosed with ADHD in 2014.  She was under the care of a provider in [place].  She also had ADHD testing done.  Previous Psychotropic Medications: Yes Adderall, Concerta  Substance Abuse History in the last 12 months:  No.  Consequences of Substance Abuse: Negative  Past Medical History:  Past Medical History:  Diagnosis Date  . Adult ADHD    ADD    Past Surgical History:  Procedure  Laterality Date  . CESAREAN SECTION  07/03/2009  . HERNIA REPAIR    . TONSILLECTOMY AND ADENOIDECTOMY     [age] years old     Family Psychiatric History: Denies history of mental health problems in her family.  Family History:  Family History  Problem Relation Age of Onset  . Thyroid disease Mother   . Cancer Father        Kidney   . Thyroid disease Sister   . Bipolar disorder Maternal Uncle   . Breast cancer Neg Hx     Social History:   Social History   Socioeconomic History  . Marital status: Married    Spouse name: Not on file  . Number of children: Not on file  . Years of education: Not on file  . Highest education level: Not on file  Occupational History  . Not on file  Tobacco Use  . Smoking status: Never Smoker  . Smokeless tobacco: Never Used  Vaping Use  . Vaping Use: Never used  Substance and Sexual Activity  . Alcohol use: Yes    Comment: SOCIAL  . Drug use: No  . Sexual activity: Yes    Birth control/protection: I.U.D.  Other Topics Concern  . Not on file  Social History Narrative  . Not on file   Social Determinants of Health   Financial Resource Strain:   . Difficulty of Paying Living Expenses: Not on file  Food Insecurity:   . Worried About Charity fundraiser in the Last Year: Not on file  . Ran Out of Food in the Last Year: Not on file  Transportation Needs:   . Lack of Transportation (Medical): Not on file  . Lack of Transportation (Non-Medical): Not on file  Physical Activity:   . Days of Exercise per Week: Not on file  . Minutes of Exercise per Session: Not on file  Stress:   . Feeling of Stress : Not on file  Social Connections:   . Frequency of Communication with Friends and Family: Not on file  . Frequency of Social Gatherings with Friends and Family: Not on file  . Attends Religious Services: Not on file  . Active Member of Clubs or Organizations: Not on file  . Attends Archivist Meetings: Not on file  . Marital  Status: Not on file    Additional Social History: Patient reports she had a good childhood.  She was raised by both parents.  Her parents were married for 52 years until her dad passed away in 01/01/2013.  She has 2 sisters.  She was born and raised in [facility].  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  She was married once in the past, divorced.  She got remarried and currently lives with her husband.  She has 3 biological children and 1 stepson.  Her children are in the age range of 50, 69, and two 75-year-old.  Patient denies any history of trauma.    Allergies:   Allergies  Allergen Reactions  . Morphine And Related Itching    Metabolic Disorder  Labs: No results found for: HGBA1C, MPG No results found for: PROLACTIN No results found for: CHOL, TRIG, HDL, CHOLHDL, VLDL, LDLCALC Lab Results  Component Value Date   TSH 1.070 09/12/2017    Therapeutic Level Labs: No results found for: LITHIUM No results found for: CBMZ No results found for: VALPROATE  Current Medications: Current Outpatient Medications  Medication Sig Dispense Refill  . Levonorgestrel (KYLEENA) 19.5 MG IUD 1 Device by Intrauterine route once for 1 dose. 1 Intra Uterine Device 0  . methylphenidate (CONCERTA) 36 MG PO CR tablet Take 1 tablet (36 mg total) by mouth daily. 30 tablet 0  . nitrofurantoin, macrocrystal-monohydrate, (MACROBID) 100 MG capsule Take 1 capsule (100 mg total) by mouth 2 (two) times daily. (Patient not taking: Reported on 06/26/2020) 14 capsule 1   No current facility-administered medications for this visit.    Musculoskeletal: Strength & Muscle Tone: UTA Gait & Station: normal Patient leans: N/A  Psychiatric Specialty Exam: Review of Systems  Psychiatric/Behavioral: Positive for decreased concentration.  All other systems reviewed and are negative.   There were no vitals taken for this visit.There is no height or weight on file to calculate BMI.  General Appearance: Casual   Eye Contact:  Fair  Speech:  Normal Rate  Volume:  Normal  Mood:  Euthymic  Affect:  Congruent  Thought Process:  Goal Directed and Descriptions of Associations: Intact  Orientation:  Full (Time, Place, and Person)  Thought Content:  Logical  Suicidal Thoughts:  No  Homicidal Thoughts:  No  Memory:  Immediate;   Fair Recent;   Fair Remote;   Fair  Judgement:  Fair  Insight:  Fair  Psychomotor Activity:  Normal  Concentration:  Concentration: Fair and Attention Span: Fair  Recall:  [employer] of Knowledge:Fair  Language: Fair  Akathisia:  No  Handed:  Right  AIMS (if indicated): UTA  Assets:  Communication Skills Desire for Improvement Social Support  ADL's:  Intact  Cognition: WNL  Sleep:  Fair   Screenings: PHQ2-9     [facility] Visit from [facility] in [facility]  PHQ-2 Total Score 0  PHQ-9 Total Score 11      Assessment and Plan: Mathew Storck is a 39 year old Caucasian female, employed, married, has a history of ADHD, was evaluated by telemedicine today.  Patient is currently struggling with attention and focus problems, denies any other mood symptoms.  Patient will benefit from the following plan.  Plan ADHD inattentive type-unstable Start Concerta 36 mg p.o. daily. I have reviewed [place] controlled substance database. Discussed controlled substance policy with patient. Discussed with patient that she will be sent for urine drug screen as needed. Discussed with patient not to mix her medication with other illicit drugs or alcohol.  History of depression-we will monitor closely.  Patient will benefit from [facility] labs.  Discussed with patient to sign a release to obtain medical records from her previous provider as well as her ADHD testing.  Follow-up in clinic in 4 weeks or sooner if needed.  I have spent atleast 45 minutes face to face by video with patient today. More than 50 % of the time was spent for preparing to see the patient ( e.g., review of  test, records ), obtaining and to review and separately obtained history , ordering medications and test ,psychoeducation and supportive psychotherapy and care coordination,as well as documenting clinical information in electronic health record. This note was generated in part or whole with voice recognition software.  Voice recognition is usually quite accurate but there are transcription errors that can and very often do occur. I apologize for any typographical errors that were not detected and corrected.        Ursula Alert, MD 10/8/20218:16 AM

## 2020-06-26 NOTE — Patient Instructions (Signed)
Methylphenidate extended-release tablets What is this medicine? METHYLPHENIDATE (meth il FEN i date) is used to treat attention-deficit hyperactivity disorder (ADHD). It is also used to treat narcolepsy. This medicine may be used for other purposes; ask your health care provider or pharmacist if you have questions. COMMON BRAND NAME(S): Concerta, Metadate ER, Methylin, RELEXXII, Ritalin SR What should I tell my health care provider before I take this medicine? They need to know if you have any of these conditions:  anxiety or panic attacks  circulation problems in fingers and toes  difficulty swallowing, problems with the esophagus, or a history of blockage of the stomach or intestines  glaucoma  hardening or blockages of the arteries or heart blood vessels  heart disease or a heart defect  high blood pressure  history of a drug or alcohol abuse problem  history of stroke  liver disease  mental illness  motor tics, family history or diagnosis of Tourette's syndrome  seizures  suicidal thoughts, plans, or attempt; a previous suicide attempt by you or a family member  thyroid disease  an unusual or allergic reaction to methylphenidate, other medicines, foods, dyes, or preservatives  pregnant or trying to get pregnant  breast-feeding How should I use this medicine? Take this medicine by mouth with a glass of water. Follow the directions on the prescription label. Do not crush, cut, or chew the tablet. You may take this medicine with food. Take your medicine at regular intervals. Do not take it more often than directed. If you take your medicine more than once a day, try to take your last dose at least 8 hours before bedtime. This well help prevent the medicine from interfering with your sleep. A special MedGuide will be given to you by the pharmacist with each prescription and refill. Be sure to read this information carefully each time. Talk to your pediatrician regarding  the use of this medicine in children. While this drug may be prescribed for children as young as 6 years for selected conditions, precautions do apply. Overdosage: If you think you have taken too much of this medicine contact a poison control center or emergency room at once. NOTE: This medicine is only for you. Do not share this medicine with others. What if I miss a dose? If you miss a dose, take it as soon as you can. If it is almost time for your next dose, take only that dose. Do not take double or extra doses. What may interact with this medicine? Do not take this medicine with any of the following medications:  lithium  MAOIs like Carbex, Eldepryl, Marplan, Nardil, and Parnate  other stimulant medicines for attention disorders, weight loss, or to stay awake  procarbazine This medicine may also interact with the following medications:  atomoxetine  caffeine  certain medicines for blood pressure, heart disease, irregular heart beat  certain medicines for depression, anxiety, or psychotic disturbances  certain medicines for seizures like carbamazepine, phenobarbital, phenytoin  cold or allergy medicines  warfarin This list may not describe all possible interactions. Give your health care provider a list of all the medicines, herbs, non-prescription drugs, or dietary supplements you use. Also tell them if you smoke, drink alcohol, or use illegal drugs. Some items may interact with your medicine. What should I watch for while using this medicine? Visit your doctor or health care professional for regular checks on your progress. This prescription requires that you follow special procedures with your doctor and pharmacy. You will need to  have a new written prescription from your doctor or health care professional every time you need a refill. This medicine may affect your concentration, or hide signs of tiredness. Until you know how this drug affects you, do not drive, ride a  bicycle, use machinery, or do anything that needs mental alertness. Tell your doctor or health care professional if this medicine loses its effects, or if you feel you need to take more than the prescribed amount. Do not change the dosage without talking to your doctor or health care professional. For males, contact your doctor or health care professional right away if you have an erection that lasts longer than 4 hours or if it becomes painful. This may be a sign of a serious problem and must be treated right away to prevent permanent damage. Decreased appetite is a common side effect when starting this medicine. Eating small, frequent meals or snacks can help. Talk to your doctor if you continue to have poor eating habits. Height and weight growth of a child taking this medicine will be monitored closely. Do not take this medicine close to bedtime. It may prevent you from sleeping. The tablet shell for some brands of this medicine does not dissolve. This is normal. The tablet shell may appear whole in the stool. This is not a cause for concern. If you are going to need surgery, a MRI, CT scan, or other procedure, tell your doctor that you are taking this medicine. You may need to stop taking this medicine before the procedure. Tell your doctor or healthcare professional right away if you notice unexplained wounds on your fingers and toes while taking this medicine. You should also tell your healthcare provider if you experience numbness or pain, changes in the skin color, or sensitivity to temperature in your fingers or toes. What side effects may I notice from receiving this medicine? Side effects that you should report to your doctor or health care professional as soon as possible:  allergic reactions like skin rash, itching or hives, swelling of the face, lips, or tongue  changes in vision  chest pain or chest tightness  fast, irregular heartbeat  fingers or toes feel numb, cool,  painful  hallucination, loss of contact with reality  high blood pressure  males: prolonged or painful erection  seizures  severe headaches  severe stomach pain, vomiting  shortness of breath  suicidal thoughts or other mood changes  trouble swallowing  trouble walking, dizziness, loss of balance or coordination  uncontrollable head, mouth, neck, arm, or leg movements  unusual bleeding or bruising Side effects that usually do not require medical attention (report to your doctor or health care professional if they continue or are bothersome):  anxious  headache  loss of appetite  nausea  trouble sleeping  weight loss This list may not describe all possible side effects. Call your doctor for medical advice about side effects. You may report side effects to FDA at 1-800-FDA-1088. Where should I keep my medicine? Keep out of the reach of children. This medicine can be abused. Keep your medicine in a safe place to protect it from theft. Do not share this medicine with anyone. Selling or giving away this medicine is dangerous and against the law. This medicine may cause accidental overdose and death if taken by other adults, children, or pets. Mix any unused medicine with a substance like cat litter or coffee grounds. Then throw the medicine away in a sealed container like a sealed bag or  a coffee can with a lid. Do not use the medicine after the expiration date. Store at room temperature between 15 and 30 degrees C (59 and 86 degrees F). Protect from light and moisture. Keep container tightly closed. NOTE: This sheet is a summary. It may not cover all possible information. If you have questions about this medicine, talk to your doctor, pharmacist, or health care provider.  2020 Elsevier/Gold Standard (2017-01-11 15:01:37)

## 2020-06-27 ENCOUNTER — Telehealth: Payer: Self-pay

## 2020-06-27 NOTE — Telephone Encounter (Signed)
received fax that a prior auth is needed for the methylphenidate hcl er 36mg 

## 2020-06-27 NOTE — Telephone Encounter (Signed)
went online and submited the prior auth- pending

## 2020-06-30 NOTE — Telephone Encounter (Signed)
received a fax that methylphenidate er was approved from  06-27-20 to 06-28-23

## 2020-07-21 ENCOUNTER — Encounter: Payer: Self-pay | Admitting: Psychiatry

## 2020-07-21 ENCOUNTER — Other Ambulatory Visit: Payer: Self-pay

## 2020-07-21 ENCOUNTER — Telehealth (INDEPENDENT_AMBULATORY_CARE_PROVIDER_SITE_OTHER): Payer: BC Managed Care – PPO | Admitting: Psychiatry

## 2020-07-21 DIAGNOSIS — Z8659 Personal history of other mental and behavioral disorders: Secondary | ICD-10-CM | POA: Diagnosis not present

## 2020-07-21 DIAGNOSIS — F900 Attention-deficit hyperactivity disorder, predominantly inattentive type: Secondary | ICD-10-CM | POA: Diagnosis not present

## 2020-07-21 MED ORDER — METHYLPHENIDATE HCL ER (OSM) 36 MG PO TBCR: 36.0000 mg | EXTENDED_RELEASE_TABLET | Freq: Every day | ORAL | 0 refills | Status: DC

## 2020-07-21 MED ORDER — METHYLPHENIDATE HCL 5 MG PO TABS: 5.0000 mg | ORAL_TABLET | Freq: Every day | ORAL | 0 refills | Status: DC

## 2020-07-21 NOTE — Progress Notes (Signed)
Virtual Visit via Video Note  I connected with Marilyn Norris on 07/21/20 at 11:45 AM EDT by a video enabled telemedicine application and verified that I am speaking with the correct person using two identifiers. Location Provider Location : [facility] Patient Location : Work  Participants: Patient , Provider   I discussed the limitations of evaluation and management by telemedicine and the availability of in person appointments. The patient expressed understanding and agreed to proceed.     I discussed the assessment and treatment plan with the patient. The patient was provided an opportunity to ask questions and all were answered. The patient agreed with the plan and demonstrated an understanding of the instructions.   The patient was advised to call back or seek an in-person evaluation if the symptoms worsen or if the condition fails to improve as anticipated.   [place] MD OP Progress Note  07/22/2020 8:34 AM Marilyn Norris  MRN:  [id number]  Chief Complaint:  Chief Complaint    Follow-up     HPI: Marilyn Norris is a 39 year old Caucasian female, employed as a Pharmacist, hospital, married, lives in [place], has a history of ADHD inattentive type, depression was evaluated by telemedicine today.  Patient today reports she is currently compliant on the Concerta 36 mg.  She reports it does help her to function during the day.  She however reports at the end of the day by around 3 PM - 4 PM she feels as though the effects wears off.  She reports she has to do a lot of work at the end of the day and hence has been struggling.  She reports sleep is good.  The medication has not affected.  Patient reports appetite is good.  Patient denies any suicidality, homicidality or perceptual disturbances.  Patient reports she did reach out to her previous provider and had advised them to send medical records to [place].  However that is still pending.  Patient denies any other concerns today.   Visit  Diagnosis:    ICD-10-CM   1. Attention deficit hyperactivity disorder (ADHD), predominantly inattentive type  F90.0 methylphenidate (CONCERTA) 36 MG PO CR tablet    methylphenidate (RITALIN) 5 MG tablet  2. History of depression  Z86.59     Past Psychiatric History: I have reviewed past psychiatric history from my progress note on 06/26/2020.  Past trials of Adderall, Concerta  Past Medical History:  Past Medical History:  Diagnosis Date  . Adult ADHD    ADD    Past Surgical History:  Procedure Laterality Date  . CESAREAN SECTION  07/03/2009  . HERNIA REPAIR    . TONSILLECTOMY AND ADENOIDECTOMY     11-33 years old     Family Psychiatric History: I have reviewed family psychiatric history from my progress note on 06/26/2020  Family History:  Family History  Problem Relation Age of Onset  . Thyroid disease Mother   . Cancer Father        Kidney   . Thyroid disease Sister   . Bipolar disorder Maternal Uncle   . Breast cancer Neg Hx     Social History: I have reviewed social history from my progress note on 06/26/2020 Social History   Socioeconomic History  . Marital status: Married    Spouse name: Not on file  . Number of children: Not on file  . Years of education: Not on file  . Highest education level: Not on file  Occupational History  . Not on file  Tobacco Use  . Smoking status: Never Smoker  . Smokeless tobacco: Never Used  Vaping Use  . Vaping Use: Never used  Substance and Sexual Activity  . Alcohol use: Yes    Comment: SOCIAL  . Drug use: No  . Sexual activity: Yes    Birth control/protection: I.U.D.  Other Topics Concern  . Not on file  Social History Narrative  . Not on file   Social Determinants of Health   Financial Resource Strain:   . Difficulty of Paying Living Expenses: Not on file  Food Insecurity:   . Worried About Charity fundraiser in the Last Year: Not on file  . Ran Out of Food in the Last Year: Not on file  Transportation  Needs:   . Lack of Transportation (Medical): Not on file  . Lack of Transportation (Non-Medical): Not on file  Physical Activity:   . Days of Exercise per Week: Not on file  . Minutes of Exercise per Session: Not on file  Stress:   . Feeling of Stress : Not on file  Social Connections:   . Frequency of Communication with Friends and Family: Not on file  . Frequency of Social Gatherings with Friends and Family: Not on file  . Attends Religious Services: Not on file  . Active Member of Clubs or Organizations: Not on file  . Attends Archivist Meetings: Not on file  . Marital Status: Not on file    Allergies:  Allergies  Allergen Reactions  . Morphine And Related Itching    Metabolic Disorder Labs: No results found for: HGBA1C, MPG No results found for: PROLACTIN No results found for: CHOL, TRIG, HDL, CHOLHDL, VLDL, LDLCALC Lab Results  Component Value Date   TSH 1.070 09/12/2017    Therapeutic Level Labs: No results found for: LITHIUM No results found for: VALPROATE No components found for:  CBMZ  Current Medications: Current Outpatient Medications  Medication Sig Dispense Refill  . Levonorgestrel (KYLEENA) 19.5 MG IUD 1 Device by Intrauterine route once for 1 dose. 1 Intra Uterine Device 0  . [START ON 07/26/2020] methylphenidate (CONCERTA) 36 MG PO CR tablet Take 1 tablet (36 mg total) by mouth daily. 30 tablet 0  . methylphenidate (RITALIN) 5 MG tablet Take 1 tablet (5 mg total) by mouth daily in the afternoon. Start taking daily at 3 pm 30 tablet 0  . nitrofurantoin, macrocrystal-monohydrate, (MACROBID) 100 MG capsule Take 1 capsule (100 mg total) by mouth 2 (two) times daily. (Patient not taking: Reported on 06/26/2020) 14 capsule 1   No current facility-administered medications for this visit.     Musculoskeletal: Strength & Muscle Tone: UTA Gait & Station: normal Patient leans: N/A  Psychiatric Specialty Exam: Review of Systems   Psychiatric/Behavioral: Positive for decreased concentration. Negative for agitation, behavioral problems, dysphoric mood, hallucinations, self-injury, sleep disturbance and suicidal ideas. The patient is not nervous/anxious and is not hyperactive.   All other systems reviewed and are negative.   There were no vitals taken for this visit.There is no height or weight on file to calculate BMI.  General Appearance: Casual  Eye Contact:  Fair  Speech:  Clear and Coherent  Volume:  Normal  Mood:  Euthymic  Affect:  Congruent  Thought Process:  Goal Directed and Descriptions of Associations: Intact  Orientation:  Full (Time, Place, and Person)  Thought Content: Logical   Suicidal Thoughts:  No  Homicidal Thoughts:  No  Memory:  Immediate;   Fair Recent;  Fair Remote;   Fair  Judgement:  Fair  Insight:  Fair  Psychomotor Activity:  Normal  Concentration:  Concentration: Fair and Attention Span: Fair  Recall:  [employer] of Knowledge: Fair  Language: Fair  Akathisia:  No  Handed:  Right  AIMS (if indicated): UTA  Assets:  Communication Skills Desire for Improvement Housing Social Support  ADL's:  Intact  Cognition: WNL  Sleep:  Fair   Screenings: PHQ2-9     [facility] Visit from [facility]/29/2020 in [facility]  PHQ-2 Total Score 0  PHQ-9 Total Score 11       Assessment and Plan: Marilyn Norris is a 39 year old Caucasian female, employed, married, has a history of ADHD was evaluated by telemedicine today.  Patient is currently doing well with attention and focus during the day however does struggle with the effect wearing off at the end of the day.  Discussed plan as noted below.  Plan ADHD inattentive type-some progress Continue Concerta 36 mg p.o. daily Start methylphenidate 5 mg p.o. daily at 3 PM. I have reviewed [place] controlled substance database.   History of depression-we will monitor closely  I have reviewed medical records from Dr. Joaquim Lai dated  07/09/2015, 01/15/2015. ' Patient had evaluation done through [facility].  She was started on Adderall.'  ' Patient at the visit on 16/06/9603 was on Concerta 36 mg.'  Pending labs-TSH-encouraged patient to get it done.  Follow-up in clinic in 4 weeks or sooner if needed.  I have spent atleast 20 minutes face to face by video with patient today. More than 50 % of the time was spent for preparing to see the patient ( e.g., review of test, records ), obtaining and to review and separately obtained history , ordering medications and test ,psychoeducation and supportive psychotherapy and care coordination,as well as documenting clinical information in electronic health record. This note was generated in part or whole with voice recognition software. Voice recognition is usually quite accurate but there are transcription errors that can and very often do occur. I apologize for any typographical errors that were not detected and corrected.        Ursula Alert, MD 07/22/2020, 8:34 AM

## 2020-07-21 NOTE — Patient Instructions (Signed)
Methylphenidate tablets What is this medicine? METHYLPHENIDATE (meth il FEN i date) is used to treat attention-deficit hyperactivity disorder (ADHD). It is also used to treat narcolepsy. This medicine may be used for other purposes; ask your health care provider or pharmacist if you have questions. COMMON BRAND NAME(S): Methylin, Ritalin What should I tell my health care provider before I take this medicine? They need to know if you have any of these conditions:  anxiety or panic attacks  circulation problems in fingers and toes  glaucoma  hardening or blockages of the arteries or heart blood vessels  heart disease or a heart defect  high blood pressure  history of a drug or alcohol abuse problem  history of stroke  liver disease  mental illness  motor tics, family history or diagnosis of Tourette's syndrome  seizures  suicidal thoughts, plans, or attempt; a previous suicide attempt by you or a family member  thyroid disease  an unusual or allergic reaction to methylphenidate, other medicines, foods, dyes, or preservatives  pregnant or trying to get pregnant  breast-feeding How should I use this medicine? Take this medicine by mouth with a glass of water. Follow the directions on the prescription label. It is best to take this medicine 30 to 45 minutes before meals, unless your doctor tells you otherwise. Take your medicine at regular intervals. Usually the last dose of the day will be taken at least 4 to 6 hours before bedtime, so it will not interfere with sleep. Do not take your medicine more often than directed. A special MedGuide will be given to you by the pharmacist with each prescription and refill. Be sure to read this information carefully each time. Talk to your pediatrician regarding the use of this medicine in children. While this drug may be prescribed for children as young as 36 years of age for selected conditions, precautions do apply. Overdosage: If you  think you have taken too much of this medicine contact a poison control center or emergency room at once. NOTE: This medicine is only for you. Do not share this medicine with others. What if I miss a dose? If you miss a dose, take it as soon as you can. If it is almost time for your next dose, take only that dose. Do not take double or extra doses. What may interact with this medicine? Do not take this medicine with any of the following medications:  lithium  MAOIs like Carbex, Eldepryl, Marplan, Nardil, and Parnate  other stimulant medicines for attention disorders, weight loss, or to stay awake  procarbazine This medicine may also interact with the following medications:  atomoxetine  caffeine  certain medicines for blood pressure, heart disease, irregular heart beat  certain medicines for depression, anxiety, or psychotic disturbances  certain medicines for seizures like carbamazepine, phenobarbital, phenytoin  cold or allergy medicines  warfarin This list may not describe all possible interactions. Give your health care provider a list of all the medicines, herbs, non-prescription drugs, or dietary supplements you use. Also tell them if you smoke, drink alcohol, or use illegal drugs. Some items may interact with your medicine. What should I watch for while using this medicine? Visit your doctor or health care professional for regular checks on your progress. This prescription requires that you follow special procedures with your doctor and pharmacy. You will need to have a new written prescription from your doctor or health care professional every time you need a refill. This medicine may affect your concentration,  or hide signs of tiredness. Until you know how this drug affects you, do not drive, ride a bicycle, use machinery, or do anything that needs mental alertness. Tell your doctor or health care professional if this medicine loses its effects, or if you feel you need to  take more than the prescribed amount. Do not change the dosage without talking to your doctor or health care professional. For males, contact your doctor or health care professional right away if you have an erection that lasts longer than 4 hours or if it becomes painful. This may be a sign of a serious problem and must be treated right away to prevent permanent damage. Decreased appetite is a common side effect when starting this medicine. Eating small, frequent meals or snacks can help. Talk to your doctor if you continue to have poor eating habits. Height and weight growth of a child taking this medicine will be monitored closely. Do not take this medicine close to bedtime. It may prevent you from sleeping. If you are going to need surgery, a MRI, CT scan, or other procedure, tell your doctor that you are taking this medicine. You may need to stop taking this medicine before the procedure. Tell your doctor or healthcare professional right away if you notice unexplained wounds on your fingers and toes while taking this medicine. You should also tell your healthcare provider if you experience numbness or pain, changes in the skin color, or sensitivity to temperature in your fingers or toes. What side effects may I notice from receiving this medicine? Side effects that you should report to your doctor or health care professional as soon as possible:  allergic reactions like skin rash, itching or hives, swelling of the face, lips, or tongue  changes in vision  chest pain or chest tightness  fast, irregular heartbeat  fingers or toes feel numb, cool, painful  hallucination, loss of contact with reality  high blood pressure  males: prolonged or painful erection  seizures  severe headaches  shortness of breath  suicidal thoughts or other mood changes  trouble walking, dizziness, loss of balance or coordination  uncontrollable head, mouth, neck, arm, or leg movements  unusual  bleeding or bruising Side effects that usually do not require medical attention (report to your doctor or health care professional if they continue or are bothersome):  anxious  headache  loss of appetite  nausea, vomiting  trouble sleeping  weight loss This list may not describe all possible side effects. Call your doctor for medical advice about side effects. You may report side effects to FDA at 1-800-FDA-1088. Where should I keep my medicine? Keep out of the reach of children. This medicine can be abused. Keep your medicine in a safe place to protect it from theft. Do not share this medicine with anyone. Selling or giving away this medicine is dangerous and against the law. This medicine may cause accidental overdose and death if taken by other adults, children, or pets. Mix any unused medicine with a substance like cat litter or coffee grounds. Then throw the medicine away in a sealed container like a sealed bag or a coffee can with a lid. Do not use the medicine after the expiration date. Store at room temperature between 15 and 30 degrees C (59 and 86 degrees F). Protect from light and moisture. Keep container tightly closed. NOTE: This sheet is a summary. It may not cover all possible information. If you have questions about this medicine, talk to your  doctor, pharmacist, or health care provider.  2020 Elsevier/Gold Standard (2017-01-11 15:03:36)

## 2020-07-25 ENCOUNTER — Telehealth: Payer: Self-pay

## 2020-07-25 NOTE — Telephone Encounter (Signed)
went online and submitted the prior auth - pending ?

## 2020-07-25 NOTE — Telephone Encounter (Signed)
received email that a PA was needed for methylphenidate HCL 5mg  tablet

## 2020-08-12 ENCOUNTER — Telehealth: Payer: Self-pay

## 2020-08-12 NOTE — Telephone Encounter (Signed)
received fax that methylphenidate 5mg  was approved from 07-25-20 to  07-26-23

## 2020-08-20 ENCOUNTER — Other Ambulatory Visit: Payer: Self-pay

## 2020-08-20 ENCOUNTER — Encounter: Payer: Self-pay | Admitting: Psychiatry

## 2020-08-20 ENCOUNTER — Telehealth (INDEPENDENT_AMBULATORY_CARE_PROVIDER_SITE_OTHER): Payer: BC Managed Care – PPO | Admitting: Psychiatry

## 2020-08-20 DIAGNOSIS — Z8659 Personal history of other mental and behavioral disorders: Secondary | ICD-10-CM | POA: Diagnosis not present

## 2020-08-20 DIAGNOSIS — F900 Attention-deficit hyperactivity disorder, predominantly inattentive type: Secondary | ICD-10-CM | POA: Diagnosis not present

## 2020-08-20 MED ORDER — METHYLPHENIDATE HCL ER (OSM) 36 MG PO TBCR: 36.0000 mg | EXTENDED_RELEASE_TABLET | Freq: Every day | ORAL | 0 refills | Status: DC

## 2020-08-20 NOTE — Progress Notes (Signed)
Virtual Visit via Video Note  I connected with Marilyn Norris on 08/20/20 at  3:00 PM EST by a video enabled telemedicine application and verified that I am speaking with the correct person using two identifiers.  Location Provider Location : [facility] Patient Location : Home  Participants: Patient , Provider   I discussed the limitations of evaluation and management by telemedicine and the availability of in person appointments. The patient expressed understanding and agreed to proceed.   I discussed the assessment and treatment plan with the patient. The patient was provided an opportunity to ask questions and all were answered. The patient agreed with the plan and demonstrated an understanding of the instructions.   The patient was advised to call back or seek an in-person evaluation if the symptoms worsen or if the condition fails to improve as anticipated.   [place] MD OP Progress Note  08/20/2020 3:35 PM Marilyn Norris  MRN:  [id number]  Chief Complaint:  Chief Complaint    Follow-up     HPI: Marilyn Norris is a 39 year old Caucasian female, employed as a Pharmacist, hospital, married, lives in [place], has a history of ADHD inattentive type, depression was evaluated by telemedicine today.  Patient today reports she is currently struggling with COVID-19 viral infection.  She reports she tested positive recently.  She reports her daughter got exposed and everyone in her home tested positive.  She is currently in quarantine.  She currently struggles with congestion and upper respiratory tract infection symptoms.  She reports she also has lost her sense of smell and taste.  She has not had a fever yet.  She reports she currently does not have any depression or anxiety symptoms are well and her anxiety about her current infection.  Overall she is coping okay.  Patient denies any suicidality, homicidality or perceptual disturbances.  She reports she did not get a chance to try the Ritalin 5 mg  at the end of the day.  She has not been very consistent with it.  She wants to give it more time.  She currently denies any side effects to her medications.  She denies any other concerns today.  Visit Diagnosis:    ICD-10-CM   1. Attention deficit hyperactivity disorder (ADHD), predominantly inattentive type  F90.0 methylphenidate (CONCERTA) 36 MG PO CR tablet  2. History of depression  Z86.59     Past Psychiatric History: I have reviewed past psychiatric history from my progress note on 06/26/2020.  Past trials of Adderall, Concerta  Past Medical History:  Past Medical History:  Diagnosis Date  . Adult ADHD    ADD    Past Surgical History:  Procedure Laterality Date  . CESAREAN SECTION  07/03/2009  . HERNIA REPAIR    . TONSILLECTOMY AND ADENOIDECTOMY     5-34 years old     Family Psychiatric History: I have reviewed family psychiatric history from my progress note on 06/26/2020  Family History:  Family History  Problem Relation Age of Onset  . Thyroid disease Mother   . Cancer Father        Kidney   . Thyroid disease Sister   . Bipolar disorder Maternal Uncle   . Breast cancer Neg Hx     Social History: Reviewed social history from my progress note on 06/26/2020 Social History   Socioeconomic History  . Marital status: Married    Spouse name: Not on file  . Number of children: Not on file  . Years of education:  Not on file  . Highest education level: Not on file  Occupational History  . Not on file  Tobacco Use  . Smoking status: Never Smoker  . Smokeless tobacco: Never Used  Vaping Use  . Vaping Use: Never used  Substance and Sexual Activity  . Alcohol use: Yes    Comment: SOCIAL  . Drug use: No  . Sexual activity: Yes    Birth control/protection: I.U.D.  Other Topics Concern  . Not on file  Social History Narrative  . Not on file   Social Determinants of Health   Financial Resource Strain:   . Difficulty of Paying Living Expenses: Not on file   Food Insecurity:   . Worried About Charity fundraiser in the Last Year: Not on file  . Ran Out of Food in the Last Year: Not on file  Transportation Needs:   . Lack of Transportation (Medical): Not on file  . Lack of Transportation (Non-Medical): Not on file  Physical Activity:   . Days of Exercise per Week: Not on file  . Minutes of Exercise per Session: Not on file  Stress:   . Feeling of Stress : Not on file  Social Connections:   . Frequency of Communication with Friends and Family: Not on file  . Frequency of Social Gatherings with Friends and Family: Not on file  . Attends Religious Services: Not on file  . Active Member of Clubs or Organizations: Not on file  . Attends Archivist Meetings: Not on file  . Marital Status: Not on file    Allergies:  Allergies  Allergen Reactions  . Morphine And Related Itching    Metabolic Disorder Labs: No results found for: HGBA1C, MPG No results found for: PROLACTIN No results found for: CHOL, TRIG, HDL, CHOLHDL, VLDL, LDLCALC Lab Results  Component Value Date   TSH 1.070 09/12/2017    Therapeutic Level Labs: No results found for: LITHIUM No results found for: VALPROATE No components found for:  CBMZ  Current Medications: Current Outpatient Medications  Medication Sig Dispense Refill  . hydrocortisone 2.5 % cream Apply topically.    . Levonorgestrel (KYLEENA) 19.5 MG IUD 1 Device by Intrauterine route once for 1 dose. 1 Intra Uterine Device 0  . [START ON 08/26/2020] methylphenidate (CONCERTA) 36 MG PO CR tablet Take 1 tablet (36 mg total) by mouth daily. 30 tablet 0  . methylphenidate (RITALIN) 5 MG tablet Take 1 tablet (5 mg total) by mouth daily in the afternoon. Start taking daily at 3 pm 30 tablet 0  . nitrofurantoin, macrocrystal-monohydrate, (MACROBID) 100 MG capsule Take 1 capsule (100 mg total) by mouth 2 (two) times daily. (Patient not taking: Reported on 06/26/2020) 14 capsule 1   No current  facility-administered medications for this visit.     Musculoskeletal: Strength & Muscle Tone: UTA Gait & Station: UTA Patient leans: N/A  Psychiatric Specialty Exam: Review of Systems  Constitutional: Positive for fatigue.  HENT: Positive for congestion.   Psychiatric/Behavioral: The patient is nervous/anxious.   All other systems reviewed and are negative.   There were no vitals taken for this visit.There is no height or weight on file to calculate BMI.  General Appearance: Casual  Eye Contact:  Fair  Speech:  Clear and Coherent  Volume:  Normal  Mood:  Anxious coping well  Affect:  Congruent  Thought Process:  Goal Directed and Descriptions of Associations: Intact  Orientation:  Full (Time, Place, and Person)  Thought Content: Logical  Suicidal Thoughts:  No  Homicidal Thoughts:  No  Memory:  Immediate;   Fair Recent;   Fair Remote;   Fair  Judgement:  Fair  Insight:  Fair  Psychomotor Activity:  Normal  Concentration:  Concentration: Fair and Attention Span: Fair  Recall:  [employer] of Knowledge: Fair  Language: Fair  Akathisia:  No  Handed:  Right  AIMS (if indicated): UTA  Assets:  Communication Skills Desire for Improvement Housing Social Support  ADL's:  Intact  Cognition: WNL  Sleep:  Fair   Screenings: PHQ2-9     [facility] Visit from 05/29/2020 in [facility]  PHQ-2 Total Score 0  PHQ-9 Total Score 11       Assessment and Plan: Marilyn Norris is a 39 year old Caucasian female, employed, married, has a history of ADHD was evaluated by telemedicine today.  Patient is currently struggling with COVID-19 infection.  Will not make any medication changes today.  Plan ADHD inattentive type-some improvement Concerta 36 mg p.o. daily Ritalin 5 mg p.o. daily at 3 PM I have provided 1 prescription for Concerta dated 96/10/9526  History of depression-we will monitor closely  Follow-up in clinic in 1 month or sooner if needed.  I have spent  atleast 15 minutes face to face by video with patient today. More than 50 % of the time was spent for preparing to see the patient ( e.g., review of test, records ), ordering medications and test ,psychoeducation and supportive psychotherapy and care coordination,as well as documenting clinical information in electronic health record. This note was generated in part or whole with voice recognition software. Voice recognition is usually quite accurate but there are transcription errors that can and very often do occur. I apologize for any typographical errors that were not detected and corrected.      Ursula Alert, MD 08/21/2020, 8:28 AM

## 2020-09-24 ENCOUNTER — Telehealth (INDEPENDENT_AMBULATORY_CARE_PROVIDER_SITE_OTHER): Payer: BC Managed Care – PPO | Admitting: Psychiatry

## 2020-09-24 ENCOUNTER — Other Ambulatory Visit: Payer: Self-pay

## 2020-09-24 ENCOUNTER — Encounter: Payer: Self-pay | Admitting: Psychiatry

## 2020-09-24 DIAGNOSIS — F900 Attention-deficit hyperactivity disorder, predominantly inattentive type: Secondary | ICD-10-CM

## 2020-09-24 DIAGNOSIS — Z8659 Personal history of other mental and behavioral disorders: Secondary | ICD-10-CM

## 2020-09-24 MED ORDER — METHYLPHENIDATE HCL 5 MG PO TABS: 5.0000 mg | ORAL_TABLET | Freq: Every day | ORAL | 0 refills | Status: DC

## 2020-09-24 MED ORDER — METHYLPHENIDATE HCL ER (OSM) 54 MG PO TBCR: 54.0000 mg | EXTENDED_RELEASE_TABLET | ORAL | 0 refills | Status: DC

## 2020-09-24 NOTE — Progress Notes (Signed)
Virtual Visit via Video Note  I connected with Marilyn Norris on 09/24/20 at  3:00 PM EST by a video enabled telemedicine application and verified that I am speaking with the correct person using two identifiers.  Location Provider Location : [facility] Patient Location : Work  Participants: Patient , Provider    I discussed the limitations of evaluation and management by telemedicine and the availability of in person appointments. The patient expressed understanding and agreed to proceed.   I discussed the assessment and treatment plan with the patient. The patient was provided an opportunity to ask questions and all were answered. The patient agreed with the plan and demonstrated an understanding of the instructions.   The patient was advised to call back or seek an in-person evaluation if the symptoms worsen or if the condition fails to improve as anticipated.   [facility] OP Progress Note  09/24/2020 5:21 PM Marilyn Norris  MRN:  [id number]  Chief Complaint:  Chief Complaint    Follow-up     HPI: Marilyn Norris is a 40 year old Caucasian female, employed as a Runner, broadcasting/film/video, married, lives in [place], has a history of ADHD inattentive type, depression was evaluated by telemedicine today.  Patient today reports she is currently struggling with focus and attention.  She reports she is taking the Concerta and the Ritalin as prescribed.  She reports in spite of taking the medication she feels as though she is not on anything and struggles with her focus throughout the day.  She reports she has a hard time organizing things and has been making an effort to function.  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  She otherwise denies any significant anxiety or mood problems.  Reports sleep as good.  Patient reports she recovered from COVID-19 infection with only mild symptoms.  She got her sense of taste and smell back and is happy about  that.  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  She reports she enjoys it.  Patient denies any suicidality, homicidality or perceptual disturbances.  Patient continues to have good support system from her husband.  She denies any other concerns today.    Visit Diagnosis:    ICD-10-CM   1. Attention deficit hyperactivity disorder (ADHD), predominantly inattentive type  F90.0 methylphenidate (CONCERTA) 54 MG PO CR tablet    methylphenidate (RITALIN) 5 MG tablet  2. History of depression  Z86.59     Past Psychiatric History: I have reviewed past psychiatric history from my progress note on 06/26/2020.  Past trials of Adderall, Concerta  Past Medical History:  Past Medical History:  Diagnosis Date  . Adult ADHD    ADD    Past Surgical History:  Procedure Laterality Date  . CESAREAN SECTION  07/03/2009  . HERNIA REPAIR    . TONSILLECTOMY AND ADENOIDECTOMY     29-14 years old     Family Psychiatric History: I have reviewed family psychiatric history from my progress note on 06/26/2020  Family History:  Family History  Problem Relation Age of Onset  . Thyroid disease Mother   . Cancer Father        Kidney   . Thyroid disease Sister   . Bipolar disorder Maternal Uncle   . Breast cancer Neg Hx     Social History: Reviewed social history from my progress note on 06/26/2020 Social History   Socioeconomic History  . Marital  status: Married    Spouse name: Not on file  . Number of children: Not on file  . Years of education: Not on file  . Highest education level: Not on file  Occupational History  . Not on file  Tobacco Use  . Smoking status: Never Smoker  . Smokeless tobacco: Never Used  Vaping Use  . Vaping Use: Never used  Substance and Sexual Activity  . Alcohol use: Yes    Comment: SOCIAL  . Drug use: No  . Sexual activity: Yes    Birth control/protection: I.U.D.  Other Topics Concern  . Not on file  Social History Narrative   . Not on file   Social Determinants of Health   Financial Resource Strain: Not on file  Food Insecurity: Not on file  Transportation Needs: Not on file  Physical Activity: Not on file  Stress: Not on file  Social Connections: Not on file    Allergies:  Allergies  Allergen Reactions  . Morphine And Related Itching    Metabolic Disorder Labs: No results found for: HGBA1C, MPG No results found for: PROLACTIN No results found for: CHOL, TRIG, HDL, CHOLHDL, VLDL, LDLCALC Lab Results  Component Value Date   TSH 1.070 09/12/2017    Therapeutic Level Labs: No results found for: LITHIUM No results found for: VALPROATE No components found for:  CBMZ  Current Medications: Current Outpatient Medications  Medication Sig Dispense Refill  . methylphenidate (CONCERTA) 54 MG PO CR tablet Take 1 tablet (54 mg total) by mouth every morning. 30 tablet 0  . hydrocortisone 2.5 % cream Apply topically.    . Levonorgestrel (KYLEENA) 19.5 MG IUD 1 Device by Intrauterine route once for 1 dose. 1 Intra Uterine Device 0  . methylphenidate (RITALIN) 5 MG tablet Take 1 tablet (5 mg total) by mouth daily in the afternoon. Start taking daily at 3 pm 30 tablet 0  . nitrofurantoin, macrocrystal-monohydrate, (MACROBID) 100 MG capsule Take 1 capsule (100 mg total) by mouth 2 (two) times daily. (Patient not taking: Reported on 06/26/2020) 14 capsule 1   No current facility-administered medications for this visit.     Musculoskeletal: Strength & Muscle Tone: UTA Gait & Station: UTA Patient leans: N/A  Psychiatric Specialty Exam: Review of Systems  Psychiatric/Behavioral: Positive for decreased concentration.  All other systems reviewed and are negative.   There were no vitals taken for this visit.There is no height or weight on file to calculate BMI.  General Appearance: Casual  Eye Contact:  Fair  Speech:  Clear and Coherent  Volume:  Normal  Mood:  Euthymic  Affect:  Congruent  Thought  Process:  Goal Directed and Descriptions of Associations: Intact  Orientation:  Full (Time, Place, and Person)  Thought Content: Logical   Suicidal Thoughts:  No  Homicidal Thoughts:  No  Memory:  Immediate;   Fair Recent;   Fair Remote;   Fair  Judgement:  Fair  Insight:  Fair  Psychomotor Activity:  Normal  Concentration:  Concentration: Fair and Attention Span: Fair  Recall:  [employer] of Knowledge: Fair  Language: Fair  Akathisia:  No  Handed:  Right  AIMS (if indicated): UTA  Assets:  Communication Skills Desire for Improvement Housing Social Support  ADL's:  Intact  Cognition: WNL  Sleep:  Fair   Screenings: PHQ2-9   [place] Office Visit from [facility]/29/2020 in [facility]  PHQ-2 Total Score 0  PHQ-9 Total Score 11  Assessment and Plan: Marilyn Norris is a 40 year old Caucasian female, employed, married, has a history of ADHD was evaluated by telemedicine today.  Patient is currently struggling with attention and focus and will benefit from dosage readjustment.  Plan as noted below.  Plan ADHD inattentive type-unstable Increase Concerta to 54 mg p.o. daily Ritalin 5 mg p.o. daily at 3 PM I have reviewed [place] controlled substance database.  History of depression-we will monitor closely.  Discussed with patient to come into the office to check her blood pressure and heart rate and also advised her to establish care with primary care provider.  Patient will also benefit from EKG which she can either get from primary care provider or it can be ordered by writer-discussed with patient.  Follow-up in clinic in 3 to 4 weeks or sooner if needed.  I have spent atleast 19 minutes face to face by video with patient today. More than 50 % of the time was spent for preparing to see the patient ( e.g., review of test, records ), ordering medications and test ,psychoeducation and supportive psychotherapy and care coordination,as well as documenting clinical  information in electronic health record. This note was generated in part or whole with voice recognition software. Voice recognition is usually quite accurate but there are transcription errors that can and very often do occur. I apologize for any typographical errors that were not detected and corrected.        Ursula Alert, MD 09/25/2020, 9:19 AM

## 2020-09-25 ENCOUNTER — Telehealth: Payer: Self-pay

## 2020-09-25 NOTE — Telephone Encounter (Signed)
Medication management - Prior authorization for Concerta 54 mg submitted online with CoverMyMeds and was sent to [facility].  Pending decision.

## 2020-09-26 NOTE — Telephone Encounter (Signed)
Medication management - Fax received from [facility] that pt's Methylphenidate ER 54 mg tablet was approved from 09/25/20-09/26/23.  Message left at pt's [facility] to inform her Concerta 54 mg dosage was approved and to call back if any problems filling for pt.

## 2020-10-27 ENCOUNTER — Other Ambulatory Visit: Payer: Self-pay

## 2020-10-27 ENCOUNTER — Encounter: Payer: Self-pay | Admitting: Psychiatry

## 2020-10-27 ENCOUNTER — Telehealth (INDEPENDENT_AMBULATORY_CARE_PROVIDER_SITE_OTHER): Payer: Self-pay | Admitting: Psychiatry

## 2020-10-27 DIAGNOSIS — Z8659 Personal history of other mental and behavioral disorders: Secondary | ICD-10-CM

## 2020-10-27 DIAGNOSIS — F900 Attention-deficit hyperactivity disorder, predominantly inattentive type: Secondary | ICD-10-CM

## 2020-10-27 MED ORDER — METHYLPHENIDATE HCL ER (OSM) 54 MG PO TBCR: 54.0000 mg | EXTENDED_RELEASE_TABLET | ORAL | 0 refills | Status: DC

## 2020-10-27 NOTE — Progress Notes (Signed)
Virtual Visit via Video Note  I connected with Marilyn Norris on 10/27/20 at  3:00 PM EST by a video enabled telemedicine application and verified that I am speaking with the correct person using two identifiers.  Location Provider Location : [facility] Patient Location : Home  Participants: Patient , Provider    I discussed the limitations of evaluation and management by telemedicine and the availability of in person appointments. The patient expressed understanding and agreed to proceed   I discussed the assessment and treatment plan with the patient. The patient was provided an opportunity to ask questions and all were answered. The patient agreed with the plan and demonstrated an understanding of the instructions.   The patient was advised to call back or seek an in-person evaluation if the symptoms worsen or if the condition fails to improve as anticipated.  [place] MD OP Progress Note  10/27/2020 3:25 PM Byrnece Scheibner  MRN:  [id number]  Chief Complaint:  Chief Complaint    Follow-up     HPI: Marilyn Norris is a 40 year old Caucasian female, employed, married, lives in [place], has a history of ADHD, inattentive type, depression was evaluated by telemedicine today.  Patient today reports she is currently making progress on the current dosage of Concerta.  She is currently taking a 54 mg.  She reports that does help her to focus throughout the day.  She has noticed a big difference compared to the previous dosage.  She denies any side effects.  She denies any heart palpitation, sleep problems, appetite suppression or anxiety symptoms.  Patient reports sleep is overall good.  She reports she forgets to take the afternoon dosage of Ritalin 5 mg most of the time.  She has been using it sporadically only.  She reports when she uses it at the right time it does help.  Patient denies any suicidality, homicidality or perceptual disturbances.  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  She reports work is going well.  Patient denies any other concerns today.  Visit Diagnosis:    ICD-10-CM   1. Attention deficit hyperactivity disorder (ADHD), predominantly inattentive type  F90.0 methylphenidate (CONCERTA) 54 MG PO CR tablet  2. History of depression  Z86.59     Past Psychiatric History: I have reviewed past psychiatric history from my progress note on 06/26/2020.  Past trials of Adderall, Concerta  Past Medical History:  Past Medical History:  Diagnosis Date   Adult ADHD    ADD    Past Surgical History:  Procedure Laterality Date   CESAREAN SECTION  07/03/2009   HERNIA REPAIR     TONSILLECTOMY AND ADENOIDECTOMY     51-18 years old     Family Psychiatric History: I have reviewed family psychiatric history from my progress note on 06/26/2020  Family History:  Family History  Problem Relation Age of Onset   Thyroid disease Mother    Cancer Father        Kidney    Thyroid disease Sister    Bipolar disorder Maternal Uncle    Breast cancer Neg Hx     Social History: I have reviewed social history from my progress note on 06/26/2020 Social History   Socioeconomic History   Marital status: Married    Spouse name: Not on file   Number of children: Not on file   Years of education: Not on file   Highest education level: Not  on file  Occupational History   Not on file  Tobacco Use   Smoking status: Never Smoker   Smokeless tobacco: Never Used  Vaping Use   Vaping Use: Never used  Substance and Sexual Activity   Alcohol use: Yes    Comment: SOCIAL   Drug use: No   Sexual activity: Yes    Birth control/protection: I.U.D.  Other Topics Concern   Not on file  Social History Narrative   Not on file   Social Determinants of Health   Financial Resource Strain: Not on file  Food Insecurity: Not on file  Transportation Needs: Not on file  Physical Activity: Not on  file  Stress: Not on file  Social Connections: Not on file    Allergies:  Allergies  Allergen Reactions   Morphine And Related Itching    Metabolic Disorder Labs: No results found for: HGBA1C, MPG No results found for: PROLACTIN No results found for: CHOL, TRIG, HDL, CHOLHDL, VLDL, LDLCALC Lab Results  Component Value Date   TSH 1.070 09/12/2017    Therapeutic Level Labs: No results found for: LITHIUM No results found for: VALPROATE No components found for:  CBMZ  Current Medications: Current Outpatient Medications  Medication Sig Dispense Refill   hydrocortisone 2.5 % cream Apply topically.     Levonorgestrel (KYLEENA) 19.5 MG IUD 1 Device by Intrauterine route once for 1 dose. 1 Intra Uterine Device 0   methylphenidate (CONCERTA) 54 MG PO CR tablet Take 1 tablet (54 mg total) by mouth every morning. 30 tablet 0   methylphenidate (RITALIN) 5 MG tablet Take 1 tablet (5 mg total) by mouth daily in the afternoon. Start taking daily at 3 pm 30 tablet 0   nitrofurantoin, macrocrystal-monohydrate, (MACROBID) 100 MG capsule Take 1 capsule (100 mg total) by mouth 2 (two) times daily. (Patient not taking: Reported on 06/26/2020) 14 capsule 1   No current facility-administered medications for this visit.     Musculoskeletal: Strength & Muscle Tone: UTA Gait & Station: UTA Patient leans: N/A  Psychiatric Specialty Exam: Review of Systems  Psychiatric/Behavioral: Negative for agitation, behavioral problems, confusion, decreased concentration, dysphoric mood, hallucinations, self-injury, sleep disturbance and suicidal ideas. The patient is not nervous/anxious and is not hyperactive.   All other systems reviewed and are negative.   There were no vitals taken for this visit.There is no height or weight on file to calculate BMI.  General Appearance: Casual  Eye Contact:  Fair  Speech:  Clear and Coherent  Volume:  Normal  Mood:  Euthymic  Affect:  Congruent  Thought  Process:  Goal Directed and Descriptions of Associations: Intact  Orientation:  Full (Time, Place, and Person)  Thought Content: Logical   Suicidal Thoughts:  No  Homicidal Thoughts:  No  Memory:  Immediate;   Fair Recent;   Fair Remote;   Fair  Judgement:  Fair  Insight:  Fair  Psychomotor Activity:  Normal  Concentration:  Concentration: Fair and Attention Span: Fair  Recall:  [employer] of Knowledge: Fair  Language: Fair  Akathisia:  No  Handed:  Right  AIMS (if indicated): UTA  Assets:  Communication Skills Desire for Improvement Housing Social Support  ADL's:  Intact  Cognition: WNL  Sleep:  Fair   Screenings: PHQ2-9   [place] Office Visit from 05/29/2020 in [facility]  PHQ-2 Total Score 0  PHQ-9 Total Score 11       Assessment and Plan: Riven Beebe is a 40 year old  Caucasian female, employed, married, has a history of ADHD was evaluated by telemedicine today.  Patient is currently making progress with regards to her attention and focus and will benefit from the following plan.  Plan ADHD inattentive type-improving Concerta 54 mg p.o. daily Ritalin 5 mg p.o. daily at 3 PM. I have reviewed [place] controlled substance database.  History of depression-we will monitor closely  Patient advised to come into the office to check her blood pressure and heart rate within the next few days.  Based on blood pressure and heart rate reading an EKG can be ordered as needed.  Patient also advised to establish care with a primary care provider .  Follow-up in clinic in 6 to 8 weeks or sooner if needed.  I have spent atleast 15 minutes face to face by video with patient today. More than 50 % of the time was spent for preparing to see the patient ( e.g., review of test, records ), ordering medications and test ,psychoeducation and supportive psychotherapy and care coordination,as well as documenting clinical information in electronic health record. This note was  generated in part or whole with voice recognition software. Voice recognition is usually quite accurate but there are transcription errors that can and very often do occur. I apologize for any typographical errors that were not detected and corrected.        Ursula Alert, MD 10/28/2020, 8:54 AM

## 2020-11-24 ENCOUNTER — Telehealth: Payer: Self-pay

## 2020-11-24 DIAGNOSIS — F900 Attention-deficit hyperactivity disorder, predominantly inattentive type: Secondary | ICD-10-CM

## 2020-11-24 MED ORDER — METHYLPHENIDATE HCL ER (OSM) 54 MG PO TBCR: 54.0000 mg | EXTENDED_RELEASE_TABLET | ORAL | 0 refills | Status: DC

## 2020-11-24 MED ORDER — METHYLPHENIDATE HCL 5 MG PO TABS: 5.0000 mg | ORAL_TABLET | Freq: Every day | ORAL | 0 refills | Status: DC

## 2020-11-24 NOTE — Telephone Encounter (Signed)
pt called states she needs refill on both methylphenidates the 54mg  and 5mg 

## 2020-11-24 NOTE — Telephone Encounter (Signed)
I have reviewed blood pressure and heart rate.  I have sent methylphenidate to pharmacy.

## 2020-11-24 NOTE — Telephone Encounter (Signed)
pt come in for vital check bp: 117/74  p: 89  T: 97.5

## 2020-12-25 ENCOUNTER — Telehealth (INDEPENDENT_AMBULATORY_CARE_PROVIDER_SITE_OTHER): Payer: Self-pay | Admitting: Psychiatry

## 2020-12-25 ENCOUNTER — Other Ambulatory Visit: Payer: Self-pay

## 2020-12-25 ENCOUNTER — Encounter: Payer: Self-pay | Admitting: Psychiatry

## 2020-12-25 DIAGNOSIS — Z8659 Personal history of other mental and behavioral disorders: Secondary | ICD-10-CM

## 2020-12-25 DIAGNOSIS — F900 Attention-deficit hyperactivity disorder, predominantly inattentive type: Secondary | ICD-10-CM

## 2020-12-25 MED ORDER — METHYLPHENIDATE HCL 5 MG PO TABS: 5.0000 mg | ORAL_TABLET | Freq: Every day | ORAL | 0 refills | Status: DC

## 2020-12-25 MED ORDER — METHYLPHENIDATE HCL ER (OSM) 54 MG PO TBCR: 54.0000 mg | EXTENDED_RELEASE_TABLET | ORAL | 0 refills | Status: DC

## 2020-12-25 NOTE — Progress Notes (Signed)
Virtual Visit via Video Note  I connected with Marilyn Norris on 12/25/20 at  3:30 PM EDT by a video enabled telemedicine application and verified that I am speaking with the correct person using two identifiers.  Location Provider Location : [facility] Patient Location : Work  Participants: Patient , Provider    I discussed the limitations of evaluation and management by telemedicine and the availability of in person appointments. The patient expressed understanding and agreed to proceed    I discussed the assessment and treatment plan with the patient. The patient was provided an opportunity to ask questions and all were answered. The patient agreed with the plan and demonstrated an understanding of the instructions.   The patient was advised to call back or seek an in-person evaluation if the symptoms worsen or if the condition fails to improve as anticipated.   [place] MD OP Progress Note  12/25/2020 6:01 PM Esperansa Sarabia  MRN:  [id number]  Chief Complaint:  Chief Complaint    Follow-up; Anxiety; ADHD     HPI: Marilyn Norris is a 40 year old Caucasian female, employed, married, lives in [place], has a history of ADHD, inattentive type, depression was evaluated by telemedicine today.  Patient today reports she is currently doing well.  She is able to focus and concentrate when she takes the Concerta.  There are days when she feels as though her concentration is wearing off at the end of the day.  She does have Ritalin available however she is not very consistent with taking it.  She reports she needs to work on that more.  She reports sleep is overall okay.  She reports work is going well.  She denies any suicidality, homicidality or perceptual disturbances.  Patient denies any other concerns today.  Visit Diagnosis:    ICD-10-CM   1. Attention deficit hyperactivity disorder (ADHD), predominantly inattentive type  F90.0 methylphenidate (CONCERTA) 54 MG PO CR tablet     methylphenidate (RITALIN) 5 MG tablet  2. History of depression  Z86.59     Past Psychiatric History: I have reviewed past psychiatric history from my progress note on 06/26/2020.  Past trials of Adderall, Concerta  Past Medical History:  Past Medical History:  Diagnosis Date  . Adult ADHD    ADD    Past Surgical History:  Procedure Laterality Date  . CESAREAN SECTION  07/03/2009  . HERNIA REPAIR    . TONSILLECTOMY AND ADENOIDECTOMY     74-63 years old     Family Psychiatric History: I have reviewed family psychiatric history from my progress note on 06/26/2020  Family History:  Family History  Problem Relation Age of Onset  . Thyroid disease Mother   . Cancer Father        Kidney   . Thyroid disease Sister   . Bipolar disorder Maternal Uncle   . Breast cancer Neg Hx     Social History: I have reviewed social history from my progress note on 06/26/2020 Social History   Socioeconomic History  . Marital status: Married    Spouse name: Not on file  . Number of children: Not on file  . Years of education: Not on file  . Highest education level: Not on file  Occupational History  . Not on file  Tobacco Use  . Smoking status: Never Smoker  . Smokeless tobacco: Never Used  Vaping Use  . Vaping Use: Never used  Substance and Sexual Activity  . Alcohol use: Yes  Comment: SOCIAL  . Drug use: No  . Sexual activity: Yes    Birth control/protection: I.U.D.  Other Topics Concern  . Not on file  Social History Narrative  . Not on file   Social Determinants of Health   Financial Resource Strain: Not on file  Food Insecurity: Not on file  Transportation Needs: Not on file  Physical Activity: Not on file  Stress: Not on file  Social Connections: Not on file    Allergies:  Allergies  Allergen Reactions  . Morphine And Related Itching    Metabolic Disorder Labs: No results found for: HGBA1C, MPG No results found for: PROLACTIN No results found for: CHOL,  TRIG, HDL, CHOLHDL, VLDL, LDLCALC Lab Results  Component Value Date   TSH 1.070 09/12/2017    Therapeutic Level Labs: No results found for: LITHIUM No results found for: VALPROATE No components found for:  CBMZ  Current Medications: Current Outpatient Medications  Medication Sig Dispense Refill  . hydrocortisone 2.5 % cream Apply topically.    . Levonorgestrel (KYLEENA) 19.5 MG IUD 1 Device by Intrauterine route once for 1 dose. 1 Intra Uterine Device 0  . methylphenidate (CONCERTA) 54 MG PO CR tablet Take 1 tablet (54 mg total) by mouth every morning. 90 tablet 0  . methylphenidate (RITALIN) 5 MG tablet Take 1 tablet (5 mg total) by mouth daily in the afternoon. Start taking daily at 3 pm 90 tablet 0  . Methylphenidate HCl ER 54 MG TB24     . nitrofurantoin, macrocrystal-monohydrate, (MACROBID) 100 MG capsule Take 1 capsule (100 mg total) by mouth 2 (two) times daily. (Patient not taking: Reported on 06/26/2020) 14 capsule 1   No current facility-administered medications for this visit.     Musculoskeletal: Strength & Muscle Tone: UTA Gait & Station: UTA Patient leans: N/A  Psychiatric Specialty Exam: Review of Systems  Psychiatric/Behavioral: Negative for agitation, behavioral problems, confusion, decreased concentration, dysphoric mood, hallucinations, self-injury, sleep disturbance and suicidal ideas. The patient is not nervous/anxious and is not hyperactive.   All other systems reviewed and are negative.   There were no vitals taken for this visit.There is no height or weight on file to calculate BMI.  General Appearance: Casual  Eye Contact:  Fair  Speech:  Clear and Coherent  Volume:  Normal  Mood:  Euthymic  Affect:  Congruent  Thought Process:  Goal Directed and Descriptions of Associations: Intact  Orientation:  Full (Time, Place, and Person)  Thought Content: Logical   Suicidal Thoughts:  No  Homicidal Thoughts:  No  Memory:  Immediate;   Fair Recent;    Fair Remote;   Fair  Judgement:  Fair  Insight:  Fair  Psychomotor Activity:  Normal  Concentration:  Concentration: Fair and Attention Span: Fair  Recall:  [employer] of Knowledge: Fair  Language: Fair  Akathisia:  No  Handed:  Right  AIMS (if indicated): UTA  Assets:  Communication Skills Desire for Improvement Housing Social Support  ADL's:  Intact  Cognition: WNL  Sleep:  Fair   Screenings: PHQ2-9   Flowsheet Row Video Visit from 12/25/2020 in [place] Office Visit from [facility]/29/2020 in [facility]  PHQ-2 Total Score 0 0  PHQ-9 Total Score -- 11    Flowsheet Row Video Visit from 12/25/2020 in [place] No Risk       Assessment and Plan: Marilyn Norris is a 40 year old Caucasian female, employed, married, has  a history of ADHD was evaluated by telemedicine today.  Patient is currently doing well on the current medication regimen however has to work on being more compliant with the afternoon dosage of regimen.  Discussed plan as noted below.  Plan ADHD inattentive type-improving Concerta 54 mg p.o. daily Ritalin 5 mg p.o. daily at 3 PM Encouraged compliance with the afternoon dosage. I have reviewed [place] controlled substance database. Provided 90-day supply  History of depression-we will monitor closely  Patient had blood pressure reading done at the office on 11/24/2020-blood pressure-117/74, pulse rate-89.  We will continue to monitor.  Follow-up in clinic in person on July 12.  This note was generated in part or whole with voice recognition software. Voice recognition is usually quite accurate but there are transcription errors that can and very often do occur. I apologize for any typographical errors that were not detected and corrected.       Ursula Alert, MD 12/25/2020, 6:01 PM

## 2021-03-31 ENCOUNTER — Encounter: Payer: Self-pay | Admitting: Psychiatry

## 2021-03-31 ENCOUNTER — Ambulatory Visit: Payer: BC Managed Care – PPO | Admitting: Psychiatry

## 2021-03-31 ENCOUNTER — Other Ambulatory Visit: Payer: Self-pay

## 2021-03-31 VITALS — BP 111/75 | HR 80 | Temp 97.9°F | Wt 144.2 lb

## 2021-03-31 DIAGNOSIS — Z8659 Personal history of other mental and behavioral disorders: Secondary | ICD-10-CM | POA: Diagnosis not present

## 2021-03-31 DIAGNOSIS — F900 Attention-deficit hyperactivity disorder, predominantly inattentive type: Secondary | ICD-10-CM | POA: Diagnosis not present

## 2021-03-31 MED ORDER — METHYLPHENIDATE HCL 5 MG PO TABS: 5.0000 mg | ORAL_TABLET | Freq: Every day | ORAL | 0 refills | Status: DC

## 2021-03-31 MED ORDER — METHYLPHENIDATE HCL ER 54 MG PO TB24: 54.0000 mg | ORAL_TABLET | Freq: Every day | ORAL | 0 refills | Status: DC

## 2021-03-31 NOTE — Progress Notes (Signed)
[place] MD OP Progress Note  03/31/2021 11:41 AM Marilyn Norris  MRN:  [id number]  Chief Complaint:  Chief Complaint   Follow-up; ADD    HPI: Marilyn Norris is a 40 year old Caucasian female, employed, married, lives in [place], has a history of ADHD inattentive type, depression was evaluated in office today.  Patient today reports she is currently going through situational stressors of buying a new home.  That does make her anxious with all the cleaning and so on.  Patient reports she however wants this change and look forward to it.  She however is overall coping with her anxiety well and has good social support system.  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  Patient reports she is compliant on her medications, the Concerta and Ritalin.  At this time the current dosage is working for her.  She denies side effects.  Patient denies any suicidality, homicidality or perceptual disturbances.  She reports sleep is good.  Patient denies any other concerns today.  Visit Diagnosis:    ICD-10-CM   1. Attention deficit hyperactivity disorder (ADHD), predominantly inattentive type  F90.0 Methylphenidate HCl ER 54 MG TB24    methylphenidate (RITALIN) 5 MG tablet    2. History of depression  Z86.59       Past Psychiatric History: I have reviewed past psychiatric history from progress note on 06/26/2020.  Past trials of Adderall, Concerta  Past Medical History:  Past Medical History:  Diagnosis Date   Adult ADHD    ADD    Past Surgical History:  Procedure Laterality Date   CESAREAN SECTION  07/03/2009   HERNIA REPAIR     TONSILLECTOMY AND 85     51-60 years old     Family Psychiatric History: Reviewed family psychiatric history from progress note on 06/26/2020  Family History:  Family History  Problem Relation Age of Onset   Thyroid disease Mother    Cancer Father        Kidney    Thyroid disease Sister     Bipolar disorder Maternal Uncle    Breast cancer Neg Hx     Social History: Reviewed social history from progress note on 06/26/2020 Social History   Socioeconomic History   Marital status: Married    Spouse name: Not on file   Number of children: Not on file   Years of education: Not on file   Highest education level: Not on file  Occupational History   Not on file  Tobacco Use   Smoking status: Never   Smokeless tobacco: Never  Vaping Use   Vaping Use: Never used  Substance and Sexual Activity   Alcohol use: Yes    Comment: SOCIAL   Drug use: No   Sexual activity: Yes    Birth control/protection: I.U.D.  Other Topics Concern   Not on file  Social History Narrative   Not on file   Social Determinants of Health   Financial Resource Strain: Not on file  Food Insecurity: Not on file  Transportation Needs: Not on file  Physical Activity: Not on file  Stress: Not on file  Social Connections: Not on file    Allergies:  Allergies  Allergen Reactions   Morphine And Related Itching    Metabolic Disorder Labs: No results found for: HGBA1C, MPG No results found for: PROLACTIN No results found for: CHOL, TRIG, HDL, CHOLHDL, VLDL, LDLCALC Lab Results  Component Value Date  TSH 1.070 09/12/2017    Therapeutic Level Labs: No results found for: LITHIUM No results found for: VALPROATE No components found for:  CBMZ  Current Medications: Current Outpatient Medications  Medication Sig Dispense Refill   methylphenidate (CONCERTA) 54 MG PO CR tablet Take 1 tablet (54 mg total) by mouth every morning. 90 tablet 0   vitamin B-12 (CYANOCOBALAMIN) 500 MCG tablet Take 500 mcg by mouth daily.     hydrocortisone 2.5 % cream Apply topically. (Patient not taking: Reported on 03/31/2021)     Levonorgestrel (KYLEENA) 19.5 MG IUD 1 Device by Intrauterine route once for 1 dose. 1 Intra Uterine Device 0   methylphenidate (RITALIN) 5 MG tablet Take 1 tablet (5 mg total) by mouth daily  in the afternoon. Start taking daily at 3 pm 90 tablet 0   Methylphenidate HCl ER 54 MG TB24 Take 54 mg by mouth daily with breakfast. 90 tablet 0   nitrofurantoin, macrocrystal-monohydrate, (MACROBID) 100 MG capsule Take 1 capsule (100 mg total) by mouth 2 (two) times daily. (Patient not taking: Reported on 03/31/2021) 14 capsule 1   No current facility-administered medications for this visit.     Musculoskeletal: Strength & Muscle Tone: within normal limits Gait & Station: normal Patient leans: N/A  Psychiatric Specialty Exam: Review of Systems  Psychiatric/Behavioral:  Negative for behavioral problems, confusion, decreased concentration, dysphoric mood, hallucinations, self-injury, sleep disturbance and suicidal ideas. The patient is nervous/anxious. The patient is not hyperactive.   All other systems reviewed and are negative.  Blood pressure 111/75, pulse 80, temperature 97.9 F (36.6 C), temperature source Temporal, weight 144 lb 3.2 oz (65.4 kg).Body mass index is 24.75 kg/m.  General Appearance: Casual  Eye Contact:  Good  Speech:  Clear and Coherent  Volume:  Normal  Mood:  Anxious coping well, situational  Affect:  Congruent  Thought Process:  Goal Directed and Descriptions of Associations: Intact  Orientation:  Full (Time, Place, and Person)  Thought Content: Logical   Suicidal Thoughts:  No  Homicidal Thoughts:  No  Memory:  Immediate;   Fair Recent;   Fair Remote;   Fair  Judgement:  Fair  Insight:  Fair  Psychomotor Activity:  Normal  Concentration:  Concentration: Fair and Attention Span: Fair  Recall:  [employer] of Knowledge: Fair  Language: Fair  Akathisia:  No  Handed:  Right  AIMS (if indicated): not done  Assets:  Communication Skills Desire for Improvement Social Support  ADL's:  Intact  Cognition: WNL  Sleep:  Fair   Screenings: PHQ2-9    [place] Office Visit from 03/31/2021 in [place] Video Visit from  12/25/2020 in [place] Office Visit from 05/29/2020 in [facility]  PHQ-2 Total Score 0 0 0  PHQ-9 Total Score -- -- 11      Flowsheet Row Video Visit from 12/25/2020 in [place] No Risk        Assessment and Plan: Nikol Lemar is a 40 year old Caucasian female, employed, married, has a history of ADHD was evaluated in office today.  Patient is currently stable on current medication regimen.  Plan as noted below.  Plan ADHD inattentive type-stable Concerta 54 mg p.o. daily Ritalin 5 mg p.o. daily at 3 PM I have reviewed [place] controlled substance database Provided 90-day supply  History of depression-we will monitor closely  Follow-up in clinic in 2 to 3 months in office.  This note was  generated in part or whole with voice recognition software. Voice recognition is usually quite accurate but there are transcription errors that can and very often do occur. I apologize for any typographical errors that were not detected and corrected.      Ursula Alert, MD 04/01/2021, 2:03 PM

## 2021-06-04 ENCOUNTER — Ambulatory Visit (INDEPENDENT_AMBULATORY_CARE_PROVIDER_SITE_OTHER): Payer: BC Managed Care – PPO | Admitting: Obstetrics and Gynecology

## 2021-06-04 ENCOUNTER — Encounter: Payer: Self-pay | Admitting: Obstetrics and Gynecology

## 2021-06-04 ENCOUNTER — Other Ambulatory Visit (HOSPITAL_COMMUNITY)
Admission: RE | Admit: 2021-06-04 | Discharge: 2021-06-04 | Disposition: A | Discharge status: Home / Self Care | Payer: BC Managed Care – PPO | Source: Ambulatory Visit | Attending: Obstetrics and Gynecology | Admitting: Obstetrics and Gynecology

## 2021-06-04 ENCOUNTER — Other Ambulatory Visit: Payer: Self-pay

## 2021-06-04 VITALS — BP 112/72 | Ht 64.0 in | Wt 144.4 lb

## 2021-06-04 DIAGNOSIS — Z01419 Encounter for gynecological examination (general) (routine) without abnormal findings: Secondary | ICD-10-CM | POA: Diagnosis not present

## 2021-06-04 DIAGNOSIS — Z0000 Encounter for general adult medical examination without abnormal findings: Secondary | ICD-10-CM

## 2021-06-04 DIAGNOSIS — Z124 Encounter for screening for malignant neoplasm of cervix: Secondary | ICD-10-CM | POA: Diagnosis not present

## 2021-06-04 NOTE — Patient Instructions (Signed)
[employer] Recommended Dietary Allowances for Calcium and Vitamin D  Age (yr) Calcium Recommended Dietary Allowance (mg/day) Vitamin D Recommended Dietary Allowance (international units/day)  9-18 1,300 600  19-50 1,000 600  51-70 1,200 600  71 and older 1,200 800  Data from [employer]. Dietary reference intakes: calcium, vitamin D. [place], [place]: [employer] Academies [employer]; 2011.   Exercising to Stay Healthy To become healthy and stay healthy, it is recommended that you do moderate-intensity and vigorous-intensity exercise. You can tell that you are exercising at a moderate intensity if your heart starts beating faster and you start breathing faster but can still hold a conversation. You can tell that you are exercising at a vigorous intensity if you are breathing much harder and faster and cannot hold a conversation while exercising. How can exercise benefit me? Exercising regularly is important. It has many health benefits, such as: Improving overall fitness, flexibility, and endurance. Increasing bone density. Helping with weight control. Decreasing body fat. Increasing muscle strength and endurance. Reducing stress and tension, anxiety, depression, or anger. Improving overall health. What guidelines should I follow while exercising? Before you start a new exercise program, talk with your health care provider. Do not exercise so much that you hurt yourself, feel dizzy, or get very short of breath. Wear comfortable clothes and wear shoes with good support. Drink plenty of water while you exercise to prevent dehydration or heat stroke. Work out until your breathing and your heartbeat get faster (moderate intensity). How often should I exercise? Choose an activity that you enjoy, and set realistic goals. Your health care provider can help you make an activity plan that is individually designed and works best for you. Exercise regularly as told by your health  care provider. This may include: Doing strength training two times a week, such as: Lifting weights. Using resistance bands. Push-ups. Sit-ups. Yoga. Doing a certain intensity of exercise for a given amount of time. Choose from these options: A total of 150 minutes of moderate-intensity exercise every week. A total of 75 minutes of vigorous-intensity exercise every week. A mix of moderate-intensity and vigorous-intensity exercise every week. Children, pregnant women, people who have not exercised regularly, people who are overweight, and older adults may need to talk with a health care provider about what activities are safe to perform. If you have a medical condition, be sure to talk with your health care provider before you start a new exercise program. What are some exercise ideas? Moderate-intensity exercise ideas include: Walking 1 mile (1.6 km) in about 15 minutes. Biking. Hiking. Golfing. Dancing. Water aerobics. Vigorous-intensity exercise ideas include: Walking 4.5 miles (7.2 km) or more in about 1 hour. Jogging or running 5 miles (8 km) in about 1 hour. Biking 10 miles (16.1 km) or more in about 1 hour. Lap swimming. Roller-skating or in-line skating. Cross-country skiing. Vigorous competitive sports, such as football, basketball, and soccer. Jumping rope. Aerobic dancing. What are some everyday activities that can help me get exercise? Yard work, such as: Pushing a lawn mower. Raking and bagging leaves. Washing your car. Pushing a stroller. Shoveling snow. Gardening. Washing windows or floors. How can I be more active in my day-to-day activities? Use stairs instead of an elevator. Take a walk during your lunch break. If you drive, park your car farther away from your work or school. If you take public transportation, get off one stop early and walk the rest of the way. Stand up or walk around during all of   your indoor phone calls. Get up, stretch, and walk  around every 30 minutes throughout the day. Enjoy exercise with a friend. Support to continue exercising will help you keep a regular routine of activity. Where to find more information You can find more information about exercising to stay healthy from: U.[place] Department of Health and Human Services: www.hhs.gov Centers for Disease Control and Prevention (CDC): www.cdc.gov Summary Exercising regularly is important. It will improve your overall fitness, flexibility, and endurance. Regular exercise will also improve your overall health. It can help you control your weight, reduce stress, and improve your bone density. Do not exercise so much that you hurt yourself, feel dizzy, or get very short of breath. Before you start a new exercise program, talk with your health care provider. This information is not intended to replace advice given to you by your health care provider. Make sure you discuss any questions you have with your health care provider. Document Revised: 01/02/2021 Document Reviewed: 01/02/2021 Elsevier Patient Education  2022 [employer]. Budget-Friendly Healthy Eating There are many ways to save money at the grocery store and continue to eat healthy. You can be successful if you: Plan meals according to your budget. Make a grocery list and only purchase food according to your grocery list. Prepare food yourself at home. What are tips for following this plan? Reading food labels Compare food labels between brand name foods and the store brand. Often the nutritional value is the same, but the store brand is lower cost. Look for products that do not have added sugar, fat, or salt (sodium). These often cost the same but are healthier for you. Products may be labeled as: Sugar-free. Nonfat. Low-fat. Sodium-free. Low-sodium. Look for lean ground beef labeled as at least 92% lean and 8% fat. Shopping  Buy only the items on your grocery list and go only to the areas of the store  that have the items on your list. Use coupons only for foods and brands you normally buy. Avoid buying items you wouldn't normally buy simply because they are on sale. Check online and in newspapers for weekly deals. Buy healthy items from the bulk bins when available, such as herbs, spices, flour, pasta, nuts, and dried fruit. Buy fruits and vegetables that are in season. Prices are usually lower on in-season produce. Look at the unit price on the price tag. Use it to compare different brands and sizes to find out which item is the best deal. Choose healthy items that are often low-cost, such as carrots, potatoes, apples, bananas, and oranges. Dried or canned beans are a low-cost protein source. Buy in bulk and freeze extra food. Items you can buy in bulk include meats, fish, poultry, frozen fruits, and frozen vegetables. Avoid buying "ready-to-eat" foods, such as pre-cut fruits and vegetables and pre-made salads. If possible, shop around to discover where you can find the best prices. Consider other retailers such as dollar stores, larger wholesale stores, local fruit and vegetable stands, and farmers markets. Do not shop when you are hungry. If you shop while hungry, it may be hard to stick to your list and budget. Resist impulse buying. Use your grocery list as your official plan for the week. Buy a variety of vegetables and fruits by purchasing fresh, frozen, and canned items. Look at the top and bottom shelves for deals. Foods at eye level (eye level of an adult or child) are usually more expensive. Be efficient with your time when shopping. The more time you   spend at the store, the more money you are likely to spend. To save money when choosing more expensive foods like meats and dairy: Choose cheaper cuts of meat, such as bone-in chicken thighs and drumsticks instead of skinless and boneless chicken. When you are ready to prepare the chicken, you can remove the skin yourself to make it  healthier. Choose lean meats like chicken or turkey instead of beef. Choose canned seafood, such as tuna, salmon, or sardines. Buy eggs as a low-cost source of protein. Buy dried beans and peas, such as lentils, split peas, or kidney beans instead of meats. Dried beans and peas are a good alternative source of protein. Buy the larger tubs of yogurt instead of individual-sized containers. Choose water instead of sodas and other sweetened beverages. Avoid buying chips, cookies, and other "junk food." These items are usually expensive and not healthy. Cooking Make extra food and freeze the extras in meal-sized containers or in individual portions for fast meals and snacks. Pre-cook on days when you have extra time to prepare meals in advance. You can keep these meals in the fridge or freezer and reheat for a quick meal. When you come home from the grocery store, wash, peel, and cut fruits and vegetables so they are ready to use and eat. This will help reduce food waste. Meal planning Do not eat out or get fast food. Prepare food at home. Make a grocery list and make sure to bring it with you to the store. If you have a smart phone, you could use your phone to create your shopping list. Plan meals and snacks according to a grocery list and budget you create. Use leftovers in your meal plan for the week. Look for recipes where you can cook once and make enough food for two meals. Prepare budget-friendly types of meals like stews, casseroles, and stir-fry dishes. Try some meatless meals or try "no cook" meals like salads. Make sure that half your plate is filled with fruits or vegetables. Choose from fresh, frozen, or canned fruits and vegetables. If eating canned, remember to rinse them before eating. This will remove any excess salt added for packaging. Summary Eating healthy on a budget is possible if you plan your meals according to your budget, purchase according to your budget and grocery list,  and prepare food yourself. Tips for buying more food on a limited budget include buying generic brands, using coupons only for foods you normally buy, and buying healthy items from the bulk bins when available. Tips for buying cheaper food to replace expensive food include choosing cheaper, lean cuts of meat, and buying dried beans and peas. This information is not intended to replace advice given to you by your health care provider. Make sure you discuss any questions you have with your health care provider. Document Revised: 06/19/2020 Document Reviewed: 06/19/2020 Elsevier Patient Education  2022 Else[employer] Inc. Bone Health Bones protect organs, store calcium, anchor muscles, and support the whole body. Keeping your bones strong is important, especially as you get older. You can take actions to help keep your bones strong and healthy. Why is keeping my bones healthy important? Keeping your bones healthy is important because your body constantly replaces bone cells. Cells get old, and new cells take their place. As we age, we lose bone cells because the body may not be able to make enough new cells to replace the old cells. The amount of bone cells and bone tissue you have is referred to as   bone mass. The higher your bone mass, the stronger your bones. The aging process leads to an overall loss of bone mass in the body, which can increase the likelihood of: Joint pain and stiffness. Broken bones. A condition in which the bones become weak and brittle (osteoporosis). A large decline in bone mass occurs in older adults. In women, it occurs about the time of menopause. What actions can I take to keep my bones healthy? Good health habits are important for maintaining healthy bones. This includes eating nutritious foods and exercising regularly. To have healthy bones, you need to get enough of the right minerals and vitamins. Most nutrition experts recommend getting these nutrients from the foods that  you eat. In some cases, taking supplements may also be recommended. Doing certain types of exercise is also important for bone health. What are the nutritional recommendations for healthy bones? Eating a well-balanced diet with plenty of calcium and vitamin D will help to protect your bones. Nutritional recommendations vary from person to person. Ask your health care provider what is healthy for you. Here are some general guidelines. Get enough calcium Calcium is the most important (essential) mineral for bone health. Most people can get enough calcium from their diet, but supplements may be recommended for people who are at risk for osteoporosis. Good sources of calcium include: Dairy products, such as low-fat or nonfat milk, cheese, and yogurt. Dark green leafy vegetables, such as bok choy and broccoli. Calcium-fortified foods, such as orange juice, cereal, bread, soy beverages, and tofu products. Nuts, such as almonds. Follow these recommended amounts for daily calcium intake: Children, age 20-3: 700 mg. Children, age 652-8: 1,000 mg. Children, age 76-13: 1,300 mg. Teens, age 85-18: 1,300 mg. Adults, age 38-50: 1,000 mg. Adults, age 206-70: Men: 1,000 mg. Women: 1,200 mg. Adults, age 73 or older: 1,200 mg. Pregnant and breastfeeding females: Teens: 1,300 mg. Adults: 1,000 mg. Get enough vitamin D Vitamin D is the most essential vitamin for bone health. It helps the body absorb calcium. Sunlight stimulates the skin to make vitamin D, so be sure to get enough sunlight. If you live in a cold climate or you do not get outside often, your health care provider may recommend that you take vitamin D supplements. Good sources of vitamin D in your diet include: Egg yolks. Saltwater fish. Milk and cereal fortified with vitamin D. Follow these recommended amounts for daily vitamin D intake: Children and teens, age 20-18: 600 international units. Adults, age 39 or younger: 400-800 international  units. Adults, age 61 or older: 800-1,000 international units. Get other important nutrients Other nutrients that are important for bone health include: Phosphorus. This mineral is found in meat, poultry, dairy foods, nuts, and legumes. The recommended daily intake for adult men and adult women is 700 mg. Magnesium. This mineral is found in seeds, nuts, dark green vegetables, and legumes. The recommended daily intake for adult men is 400-420 mg. For adult women, it is 310-320 mg. Vitamin K. This vitamin is found in green leafy vegetables. The recommended daily intake is 120 mg for adult men and 90 mg for adult women. What type of physical activity is best for building and maintaining healthy bones? Weight-bearing and strength-building activities are important for building and maintaining healthy bones. Weight-bearing activities cause muscles and bones to work against gravity. Strength-building activities increase the strength of the muscles that support bones. Weight-bearing and muscle-building activities include: Walking and hiking. Jogging and running. Dancing. Gym exercises. Lifting weights. Tennis  and racquetball. Climbing stairs. Aerobics. Adults should get at least 30 minutes of moderate physical activity on most days. Children should get at least 60 minutes of moderate physical activity on most days. Ask your health care provider what type of exercise is best for you. How can I find out if my bone mass is low? Bone mass can be measured with an X-ray test called a bone mineral density (BMD) test. This test is recommended for all women who are age 51 or older. It may also be recommended for: Men who are age 40 or older. People who are at risk for osteoporosis because of: Having bones that break easily. Having a long-term disease that weakens bones, such as kidney disease or rheumatoid arthritis. Having menopause earlier than normal. Taking medicine that weakens bones, such as steroids,  thyroid hormones, or hormone treatment for breast cancer or prostate cancer. Smoking. Drinking three or more alcoholic drinks a day. If you find that you have a low bone mass, you may be able to prevent osteoporosis or further bone loss by changing your diet and lifestyle. Where can I find more information? For more information, check out the following websites: [place]: [employer] of Health: www.bones.SouthExposed.es International Osteoporosis Foundation: Administrator.iofbonehealth.org Summary The aging process leads to an overall loss of bone mass in the body, which can increase the likelihood of broken bones and osteoporosis. Eating a well-balanced diet with plenty of calcium and vitamin D will help to protect your bones. Weight-bearing and strength-building activities are also important for building and maintaining strong bones. Bone mass can be measured with an X-ray test called a bone mineral density (BMD) test. This information is not intended to replace advice given to you by your health care provider. Make sure you discuss any questions you have with your health care provider. Document Revised: 10/03/2017 Document Reviewed: 10/03/2017 Elsevier Patient Education  2022 [employer].

## 2021-06-04 NOTE — Progress Notes (Signed)
Gynecology Annual Exam  PCP: Patient, No Pcp Per (Inactive)  Chief Complaint:  Chief Complaint  Patient presents with   Gynecologic Exam    History of Present Illness: Patient is a 40 y.o. G3P3003 presents for annual exam. The patient has no complaints today.   LMP: No LMP recorded. (Menstrual status: IUD). Average Interval: monthly symptoms, but light spotting  Duration of flow:  1-2  days Heavy Menses: no Dysmenorrhea: no  She denies passage of large clots She denies sensations of gushing or flooding of blood. She denies accidents where she bleeds through her clothing. She denies that she changes a saturated pad or tampon more frequently than every hour.  She denies that pain from her periods limits her activities.  The patient does perform self breast exams.  There is no notable family history of breast or ovarian cancer in her family.  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  The patient denies current symptoms of depression.   PHQ-9: 1 GAD-7: 0   Review of Systems: Review of Systems  Constitutional:  Negative for chills, fever, malaise/fatigue and weight loss.  HENT:  Negative for congestion, hearing loss and sinus pain.   Eyes:  Negative for blurred vision and double vision.  Respiratory:  Negative for cough, sputum production, shortness of breath and wheezing.   Cardiovascular:  Negative for chest pain, palpitations, orthopnea and leg swelling.  Gastrointestinal:  Negative for abdominal pain, constipation, diarrhea, nausea and vomiting.  Genitourinary:  Negative for dysuria, flank pain, frequency, hematuria and urgency.  Musculoskeletal:  Negative for back pain, falls and joint pain.  Skin:  Negative for itching and rash.  Neurological:  Negative for dizziness and headaches.  Psychiatric/Behavioral:  Negative for depression, substance abuse and suicidal ideas. The patient is not nervous/anxious.    Past Medical  History:  Past Medical History:  Diagnosis Date   Adult ADHD    ADD   Dysplastic nevus 04/12/2018   R inf breast - mild   Dysplastic nevus 04/12/2018   L mid to upper back 4.0 cm lat to spine - severe, excision 11/14/2018   Dysplastic nevus 01/11/2019   R med sup pubic area - mild    Past Surgical History:  Past Surgical History:  Procedure Laterality Date   CESAREAN SECTION  07/03/2009   HERNIA REPAIR     TONSILLECTOMY AND ADENOIDECTOMY     80-76 years old     Gynecologic History:  No LMP recorded. (Menstrual status: IUD). Menarche: 13  History of fibroids, polyps, or ovarian cysts? : no  History of PCOS? no Hstory of Endometriosis? no History of abnormal pap smears? yes Have you had any sexually transmitted infections in the past? no  She denies HPV vaccination in the past. She declines vaccination today  Last Pap: Results were: 2018 NIL and HR HPV negative    She identifies as a female. She is sexually active with men.   She denies dyspareunia. She sometimes has postcoital bleeding when it is close to her menstural cycle.  She currently uses IUD for contraception.    Obstetric History: [id number]  Family History:  Family History  Problem Relation Age of Onset   Thyroid disease Mother    Cancer Father        Kidney    Thyroid disease Sister    Bipolar disorder Maternal Uncle    Breast cancer Neg Hx     Social History:  Social History  Socioeconomic History   Marital status: Married    Spouse name: Not on file   Number of children: Not on file   Years of education: Not on file   Highest education level: Not on file  Occupational History   Not on file  Tobacco Use   Smoking status: Never   Smokeless tobacco: Never  Vaping Use   Vaping Use: Never used  Substance and Sexual Activity   Alcohol use: Yes    Comment: SOCIAL   Drug use: No   Sexual activity: Yes    Birth control/protection: I.U.D.  Other Topics Concern   Not on file  Social  History Narrative   Not on file   Social Determinants of Health   Financial Resource Strain: Not on file  Food Insecurity: Not on file  Transportation Needs: Not on file  Physical Activity: Not on file  Stress: Not on file  Social Connections: Not on file  Intimate Partner Violence: Not on file    Allergies:  Allergies  Allergen Reactions   Morphine And Related Itching    Medications: Prior to Admission medications   Medication Sig Start Date End Date Taking? Authorizing Provider  methylphenidate (CONCERTA) 54 MG PO CR tablet Take 1 tablet (54 mg total) by mouth every morning. 12/25/20  Yes Ursula Alert, MD  methylphenidate (RITALIN) 5 MG tablet Take 1 tablet (5 mg total) by mouth daily in the afternoon. Start taking daily at 3 pm 03/31/21  Yes Ursula Alert, MD  Methylphenidate HCl ER 54 MG TB24 Take 54 mg by mouth daily with breakfast. 03/31/21  Yes Eappen, Ria Clock, MD  hydrocortisone 2.5 % cream Apply topically. Patient not taking: Reported on 03/31/2021 08/08/20   [provider]  Levonorgestrel (KYLEENA) 19.5 MG IUD 1 Device by Intrauterine route once for 1 dose. 09/12/17 06/26/20  Will Bonnet, MD  nitrofurantoin, macrocrystal-monohydrate, (MACROBID) 100 MG capsule Take 1 capsule (100 mg total) by mouth 2 (two) times daily. Patient not taking: Reported on 03/31/2021 10/16/18   Rexene Agent, CNM  vitamin B-12 (CYANOCOBALAMIN) 500 MCG tablet Take 500 mcg by mouth daily. Patient not taking: Reported on 06/04/2021    [provider]    Physical Exam Vitals: Blood pressure 112/72, height '5\' 4"'$  (1.626 m), weight 144 lb 6.4 oz (65.5 kg).  Physical Exam Constitutional:      Appearance: Normal appearance. She is well-developed.  Genitourinary:     Genitourinary Comments: External: Normal appearing vulva. No lesions noted.  Speculum examination: Normal appearing cervix. No blood in the vaginal vault. No discharge.   IUD strings seen Bimanual  examination: Uterus midline, non-tender, normal in size, shape and contour.  No CMT. No adnexal masses. No adnexal tenderness. Pelvis not fixed.  Breast Exam: breast equal without skin changes, nipple discharge, breast lump or enlarged lymph nodes   HENT:     Head: Normocephalic and atraumatic.  Eyes:     Extraocular Movements: Extraocular movements intact.     Pupils: Pupils are equal, round, and reactive to light.  Neck:     Thyroid: No thyromegaly.  Cardiovascular:     Rate and Rhythm: Normal rate and regular rhythm.     Heart sounds: Normal heart sounds.  Pulmonary:     Effort: Pulmonary effort is normal.     Breath sounds: Normal breath sounds.  Abdominal:     General: Bowel sounds are normal. There is no distension.     Palpations: Abdomen is soft. There is no mass.  Musculoskeletal:     Cervical back: Neck supple.  Neurological:     Mental Status: She is alert and oriented to person, place, and time.  Skin:    General: Skin is warm and dry.  Psychiatric:        Behavior: Behavior normal.        Thought Content: Thought content normal.        Judgment: Judgment normal.  Vitals reviewed.     Female chaperone present for pelvic and breast  portions of the physical exam  Assessment: 40 y.o. G3P3003 routine annual exam  Plan: Problem List Items Addressed This Visit   None Visit Diagnoses     Encounter for annual routine gynecological examination    -  Primary   Health maintenance examination       Cervical cancer screening       Relevant Orders   Cytology - PAP       1) STI screening was offered and declined  2) ASCCP guidelines and rational discussed.  Pap smear performed today  3) Contraception -She would like to continue with her IUD. Kyleena IUD placed 09/12/2017, due for exchange next year  4) Routine healthcare maintenance including cholesterol, diabetes screening discussed managed by PCP   Adrian Prows MD, [place], [place] 06/04/2021 6:53 PM

## 2021-06-09 LAB — CYTOLOGY - PAP
Comment: NEGATIVE
Diagnosis: NEGATIVE
High risk HPV: NEGATIVE

## 2021-06-24 ENCOUNTER — Ambulatory Visit: Payer: Self-pay | Admitting: Dermatology

## 2021-06-25 ENCOUNTER — Ambulatory Visit: Payer: Self-pay | Admitting: Dermatology

## 2021-06-30 ENCOUNTER — Encounter: Payer: Self-pay | Admitting: Psychiatry

## 2021-06-30 ENCOUNTER — Other Ambulatory Visit: Payer: Self-pay

## 2021-06-30 ENCOUNTER — Ambulatory Visit (INDEPENDENT_AMBULATORY_CARE_PROVIDER_SITE_OTHER): Payer: BC Managed Care – PPO | Admitting: Psychiatry

## 2021-06-30 VITALS — BP 105/70 | HR 95 | Temp 98.3°F | Wt 145.8 lb

## 2021-06-30 DIAGNOSIS — Z8659 Personal history of other mental and behavioral disorders: Secondary | ICD-10-CM

## 2021-06-30 DIAGNOSIS — F09 Unspecified mental disorder due to known physiological condition: Secondary | ICD-10-CM | POA: Insufficient documentation

## 2021-06-30 DIAGNOSIS — F900 Attention-deficit hyperactivity disorder, predominantly inattentive type: Secondary | ICD-10-CM | POA: Diagnosis not present

## 2021-06-30 MED ORDER — METHYLPHENIDATE HCL 5 MG PO TABS: 5.0000 mg | ORAL_TABLET | Freq: Every day | ORAL | 0 refills | Status: DC

## 2021-06-30 MED ORDER — METHYLPHENIDATE HCL ER (OSM) 54 MG PO TBCR: 54.0000 mg | EXTENDED_RELEASE_TABLET | ORAL | 0 refills | Status: DC

## 2021-06-30 NOTE — Patient Instructions (Signed)
Amphetamine; Dextroamphetamine Extended-Release Capsules What is this medication? AMPHETAMINE; DEXTROAMPHETAMINE (am FET a meen; dex troe am FET a meen) treats attention-deficit hyperactivity disorder (ADHD). It works by improving focus and reducing impulsive behavior. It belongs to a group of medications called stimulants. This medicine may be used for other purposes; ask your health care provider or pharmacist if you have questions. COMMON BRAND NAME(S): Adderall XR, Mydayis What should I tell my care team before I take this medication? They need to know if you have any of these conditions: Anxiety or panic attacks Circulation problems in fingers and toes (Raynaud's disease) Glaucoma Heart attack Heart disease High blood pressure History of alcohol or drug abuse or addiction Kidney disease Liver disease Mental health disease Previous suicide attempt by you or a family member Seizures Stroke Suicidal thoughts, plans, or attempt Thyroid disease Tourette's syndrome An unusual or allergic reaction to dextroamphetamine, other amphetamines, other medications, foods, dyes, or preservatives Pregnant or trying to get pregnant Breast-feeding How should I use this medication? Take this medication by mouth with water. Take it as directed on the prescription label at the same time every day. You can take it with or without food. If it upsets your stomach, take it with food. Do not cut, crush, or chew this medication. Swallow the capsules whole. You may open the capsule and put the contents in 1 teaspoon of applesauce. Swallow the medication and applesauce right away. Do not chew the medication or applesauce. Keep taking it unless your care team tells you to stop. A special MedGuide will be given to you by the pharmacist with each prescription and refill. Be sure to read this information carefully each time. Talk to your care team about the use of this medication in children. While it may be  prescribed for children as young as 6 years for selected conditions, precautions do apply. Overdosage: If you think you have taken too much of this medicine contact a poison control center or emergency room at once. NOTE: This medicine is only for you. Do not share this medicine with others. What if I miss a dose? If you miss a dose, take it as soon as you can in the morning, but do not take it later in the day because it can cause trouble sleeping. If it is almost time for your next dose, take only that dose. Do not take double or extra doses. What may interact with this medication? Do not take this medication with any of the following: Linezolid MAOIs like Carbex, Eldepryl, Marplan, Nardil, and Parnate Methylene blue (injected into a vein) Other stimulant medications for attention disorders, weight loss, or to stay awake This medication may also interact with the following: Acetazolamide Ammonium chloride Ascorbic acid Atomoxetine Certain medications for blood pressure, heart disease, irregular heart beat Certain medications for depression, anxiety, or psychotic disturbances Cold or allergy medications Lithium Methenamine Narcotic medicines for pain Quinidine Ritonavir Sodium bicarbonate St. John's Wort Tryptophan This list may not describe all possible interactions. Give your health care provider a list of all the medicines, herbs, non-prescription drugs, or dietary supplements you use. Also tell them if you smoke, drink alcohol, or use illegal drugs. Some items may interact with your medicine. What should I watch for while using this medication? Visit your care team for regular checks on your progress. This prescription requires that you follow special procedures with your care team and pharmacy. You will need to have a new written prescription from your care team every time you  need a refill. This medication may affect your concentration, or hide signs of tiredness. Until you know  how this medication affects you, do not drive, ride a bicycle, use machinery, or do anything that needs mental alertness. Alcohol should be avoided with some brands of this medication. Talk to your care team if you have questions. Tell your care team if this medication loses its effects, or if you feel you need to take more than the prescribed amount. Do not change the dosage without talking to your care team. Decreased appetite is a common side effect when starting this medication. Eating small, frequent meals or snacks can help. Talk to your care team if you continue to have poor eating habits. Height and weight growth of a child taking this medication will be monitored closely. Do not take this medication close to bedtime. It may prevent you from sleeping. Tell your care team right away if you notice unexplained wounds on your fingers and toes while taking this medication. You should also tell your care team if you experience numbness or pain, changes in the skin color, or sensitivity to temperature in your fingers or toes. What side effects may I notice from receiving this medication? Side effects that you should report to your care team as soon as possible: Allergic reactions-skin rash, itching, hives, swelling of the face, lips, tongue, or throat Raynaud's-cool, numb, or painful fingers or toes that may change color from pale, to blue, to red Heart rhythm changes-fast or irregular heartbeat, dizziness, feeling faint or lightheaded, chest pain, trouble breathing Increase in blood pressure Mood and behavior changes-anxiety, nervousness, confusion, hallucinations, irritability, hostility, thoughts of suicide or self-harm, worsening mood, feelings of depression Painful or prolonged erections Seizures Stroke in adults-sudden numbness or weakness of the face, arm or leg, trouble speaking, confusion, trouble walking, loss of balance or coordination, dizziness, severe headache, change in vision Side  effects that usually do not require medical attention (report to your care team if they continue or are bothersome): Blurry vision Headache Loss of appetite Nausea Trouble sleeping Weight loss This list may not describe all possible side effects. Call your doctor for medical advice about side effects. You may report side effects to FDA at 1-800-FDA-1088. Where should I keep my medication? Keep out of the reach of children and pets. This medication can be abused. Keep it in a safe place to protect it from theft. Do not share it with anyone. It is only for you. Selling or giving away this medication is dangerous and against the law. Store at room temperature between 15 and 30 degrees C (59 and 86 degrees F). Protect from light and moisture. Keep container tightly closed. Get rid of any unused medication after the expiration date. This medication may cause harm and death if it is taken by other adults, children, or pets. It is important to get rid of the medication as soon as you no longer need it or it is expired. You can do this in two ways: Take the medication to a medication take-back program. Check with your pharmacy or law enforcement to find a location. If you cannot return the medication, check the label or package insert to see if the medication should be thrown out in the garbage or flushed down the toilet. If you are not sure, ask your care team. If it is safe to put it in the trash, take the medication out of the container. Mix the medication with cat litter, dirt, coffee grounds, or other  unwanted substance. Seal the mixture in a bag or container. Put it in the trash. NOTE: This sheet is a summary. It may not cover all possible information. If you have questions about this medicine, talk to your doctor, pharmacist, or health care provider.  2022 [employer]/Gold Standard (2020-12-31 12:51:46)

## 2021-06-30 NOTE — Progress Notes (Signed)
[facility] MD OP Progress Note  06/30/2021 5:16 PM Marilyn Norris  MRN:  [id number]  Chief Complaint:  Chief Complaint   Follow-up; ADD    HPI: Marilyn Norris is a 40 year old Caucasian female, employed, married, lives in [place], has a history of ADHD inattentive type, depression was evaluated in office today.  Patient today reports work is busy and stressful.  She works as an Automotive engineer.  She reports she has to work continuously most days without a break.  She is excited about tomorrow since it is PTO day and parents are coming in to help out during lunch break and she can have a normal lunch break for the first time this year.  Patient reports she is currently struggling with multitasking, has several tabs open on her computer and feels as though she is all over the place.  She does struggle with organization skills.  Patient reports it is likely her Concerta extended release not helpful like it used to before.  Since she ran out of off the Concerta extended release yesterday she took Ritalin 5 mg in the morning and in the afternoon.  She reports that kind of helped her better than the extended release.  She however struggles with taking medications in divided dosage and is not interested in changing it into immediate release at this time.  Patient reports she also has been struggling with cognitive issues, word-finding difficulties, retaining information, learning new topics and memory changes, wonders whether she has a learning disability or anything similar and is open to getting another neuropsychological testing done.  She did have a previous neuropsychological testing done several years ago.  At that time she was diagnosed with ADHD and was started on medications.  Patient however in session today appeared to be alert, oriented was able to answer questions appropriately and her immediate and recent memory seemed to be fine.  She did not have any word-finding difficulties in  session today.  Patient denies any significant depression or anxiety symptoms.  She denies any suicidality, homicidality or perceptual disturbances.  Patient does report stress eating, prefers chocolate however denies any other binging on food or other eating disorder symptoms.  Patient denies any side effects to her medications.  She denies any other concerns today.  Visit Diagnosis:    ICD-10-CM   1. Attention deficit hyperactivity disorder (ADHD), predominantly inattentive type  F90.0 methylphenidate (CONCERTA) 54 MG PO CR tablet    methylphenidate (RITALIN) 5 MG tablet    TSH    2. Cognitive disorder  F09     3. History of depression  Z86.59 TSH      Past Psychiatric History: Reviewed past psychiatric history from progress note on 06/26/2020.  Past trials of Adderall, Concerta  Past Medical History:  Past Medical History:  Diagnosis Date   Adult ADHD    ADD   Dysplastic nevus 04/12/2018   R inf breast - mild   Dysplastic nevus 04/12/2018   L mid to upper back 4.0 cm lat to spine - severe, excision 11/14/2018   Dysplastic nevus 01/11/2019   R med sup pubic area - mild    Past Surgical History:  Procedure Laterality Date   CESAREAN SECTION  07/03/2009   HERNIA REPAIR     TONSILLECTOMY AND 62     76-72 years old     Family Psychiatric History: Reviewed family psychiatric history from progress note on 06/26/2020  Family History:  Family History  Problem Relation Age of Onset  Thyroid disease Mother    Cancer Father        Kidney    Thyroid disease Sister    Bipolar disorder Maternal Uncle    Breast cancer Neg Hx     Social History: Reviewed social history from progress note on 06/26/2020 Social History   Socioeconomic History   Marital status: Married    Spouse name: Not on file   Number of children: Not on file   Years of education: Not on file   Highest education level: Not on file  Occupational History   Not on file  Tobacco Use    Smoking status: Never   Smokeless tobacco: Never  Vaping Use   Vaping Use: Never used  Substance and Sexual Activity   Alcohol use: Yes    Comment: SOCIAL   Drug use: No   Sexual activity: Yes    Birth control/protection: I.U.D.  Other Topics Concern   Not on file  Social History Narrative   Not on file   Social Determinants of Health   Financial Resource Strain: Not on file  Food Insecurity: Not on file  Transportation Needs: Not on file  Physical Activity: Not on file  Stress: Not on file  Social Connections: Not on file    Allergies:  Allergies  Allergen Reactions   Morphine And Related Itching    Metabolic Disorder Labs: No results found for: HGBA1C, MPG No results found for: PROLACTIN No results found for: CHOL, TRIG, HDL, CHOLHDL, VLDL, LDLCALC Lab Results  Component Value Date   TSH 1.070 09/12/2017    Therapeutic Level Labs: No results found for: LITHIUM No results found for: VALPROATE No components found for:  CBMZ  Current Medications: Current Outpatient Medications  Medication Sig Dispense Refill   hydrocortisone 2.5 % cream Apply topically. (Patient not taking: Reported on 03/31/2021)     Levonorgestrel (KYLEENA) 19.5 MG IUD 1 Device by Intrauterine route once for 1 dose. 1 Intra Uterine Device 0   methylphenidate (CONCERTA) 54 MG PO CR tablet Take 1 tablet (54 mg total) by mouth every morning. 90 tablet 0   methylphenidate (RITALIN) 5 MG tablet Take 1 tablet (5 mg total) by mouth daily in the afternoon. Start taking daily at 3 pm 90 tablet 0   Methylphenidate HCl ER 54 MG TB24 Take 54 mg by mouth daily with breakfast. 90 tablet 0   nitrofurantoin, macrocrystal-monohydrate, (MACROBID) 100 MG capsule Take 1 capsule (100 mg total) by mouth 2 (two) times daily. (Patient not taking: Reported on 03/31/2021) 14 capsule 1   vitamin B-12 (CYANOCOBALAMIN) 500 MCG tablet Take 500 mcg by mouth daily. (Patient not taking: Reported on 06/04/2021)     No current  facility-administered medications for this visit.     Musculoskeletal: Strength & Muscle Tone: within normal limits Gait & Station: normal Patient leans: N/A  Psychiatric Specialty Exam: Review of Systems  Psychiatric/Behavioral:  Positive for decreased concentration.   All other systems reviewed and are negative.  Blood pressure 105/70, pulse 95, temperature 98.3 F (36.8 C), weight 145 lb 12.8 oz (66.1 kg).Body mass index is 25.03 kg/m.  General Appearance: Casual  Eye Contact:  Fair  Speech:  Clear and Coherent  Volume:  Normal  Mood:  Euthymic  Affect:  Congruent  Thought Process:  Goal Directed and Descriptions of Associations: Intact  Orientation:  Full (Time, Place, and Person)  Thought Content: Logical   Suicidal Thoughts:  No  Homicidal Thoughts:  No  Memory:  Immediate;  Fair Recent;   Fair Remote;   Fair  Judgement:  Fair  Insight:  Fair  Psychomotor Activity:  Normal  Concentration:  Concentration: Fair and Attention Span: Fair  Recall:  [employer] of Knowledge: Fair  Language: Fair  Akathisia:  No  Handed:  Right  AIMS (if indicated): done  Assets:  Communication Skills Desire for Improvement Housing Social Support  ADL's:  Intact  Cognition: WNL  Sleep:  Fair   Screenings: PHQ2-9    [place] Office Visit from 03/31/2021 in [facility] Video Visit from 12/25/2020 in [facility] Visit from 05/29/2020 in [facility]  PHQ-2 Total Score 0 0 0  PHQ-9 Total Score -- -- 11      Flowsheet Row Video Visit from 12/25/2020 in [facility] No Risk        Assessment and Plan: Marilyn Norris is a 40 year old Caucasian female, employed, married, has a history of ADHD was evaluated in office today.  Patient is currently struggling with concentration, retaining information, word processing, however is not interested in medication  changes at this time.  Patient is open to getting another neuropsychological testing done.  Discussed plan as noted below.  Plan ADHD inattentive type-unstable Concerta 54 mg p.o. daily Ritalin 5 mg p.o. daily at 3 PM I have reviewed [place] PMP aware Patient provided information about other medication trials including Adderall extended release,Mydayis, Vyvanse.  Patient to contact clinic sooner if she is interested. AIMS - 0  Cognitive disorder-unstable Likely multifactorial including work-related stressors as well as her ADHD. We will get TSH labs-patient to go to [facility].  Provided lab slip. Could consider changing her stimulant medication as noted above however she is not interested at this time. Will refer for neuropsychological testing based on patient preference. Also discussed referral for psychotherapy CBT.  Patient to establish care.  History of depression-we will monitor closely  Follow-up in clinic in 2 to 3 months or sooner in office.  This note was generated in part or whole with voice recognition software. Voice recognition is usually quite accurate but there are transcription errors that can and very often do occur. I apologize for any typographical errors that were not detected and corrected.       Ursula Alert, MD 06/30/2021, 5:16 PM

## 2021-07-16 ENCOUNTER — Encounter: Payer: Self-pay | Admitting: Psychology

## 2021-08-09 IMAGING — MG DIGITAL DIAGNOSTIC BILAT W/ TOMO W/ CAD
8 of 14 series · 8 of 40 positions shown · non-contrast
Comparison: None.

CLINICAL DATA: 39-year-old female presenting for evaluation of a
palpable lump in each breast identified on clinical breast exam.

EXAM:
DIGITAL DIAGNOSTIC BILATERAL MAMMOGRAM WITH TOMO AND CAD; ULTRASOUND
LEFT BREAST LIMITED; ULTRASOUND RIGHT BREAST LIMITED

[L XCCL synth-2D]
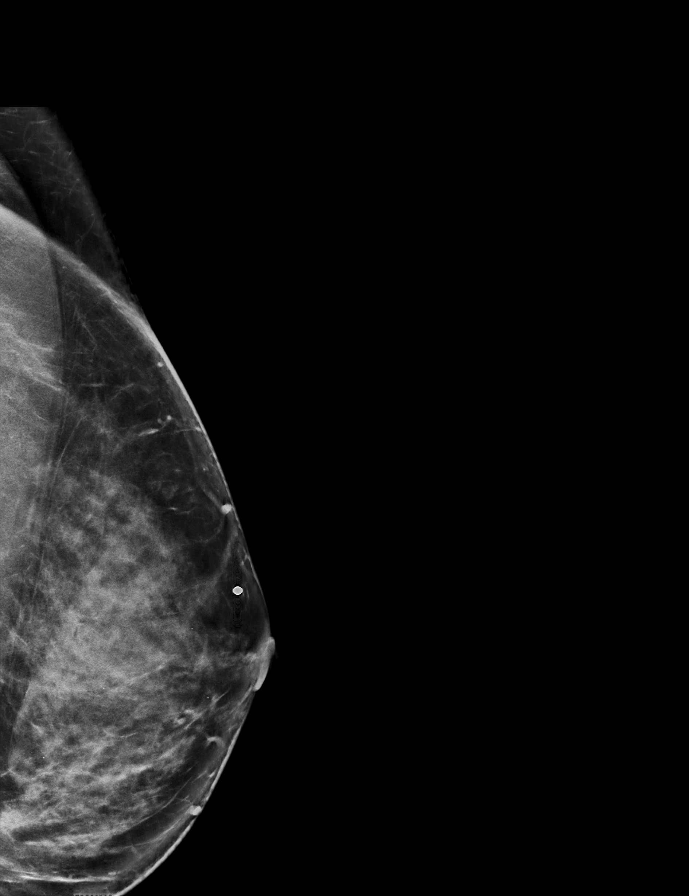

[L MLO synth-2D]
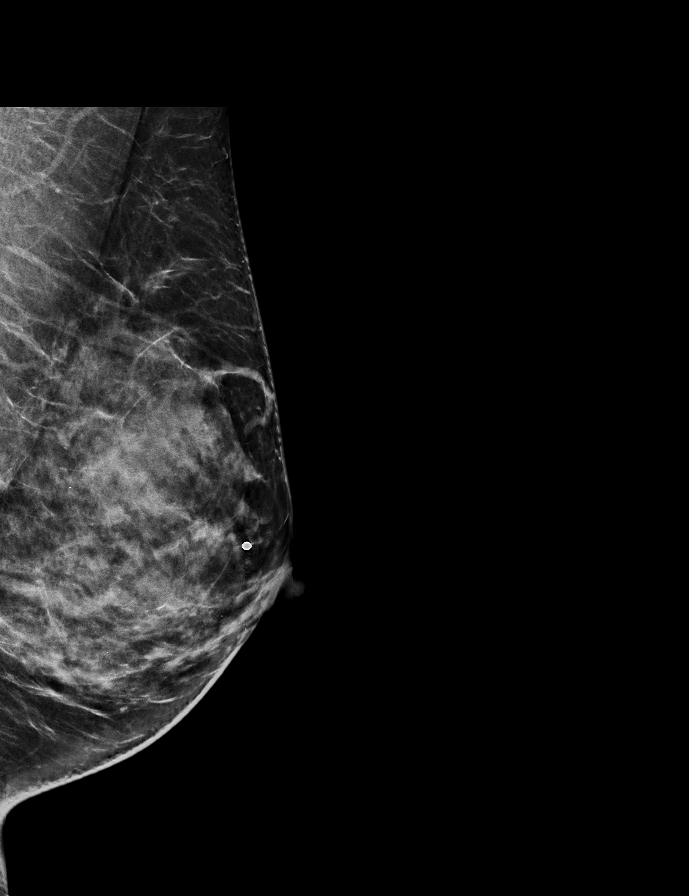

[R CC synth-2D]
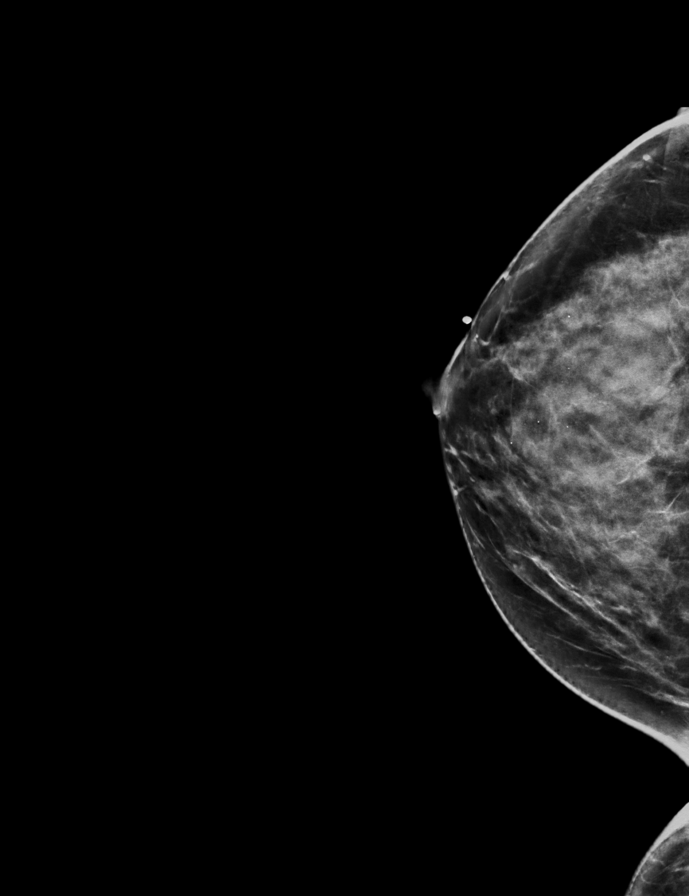

[R TAN synth-2D]
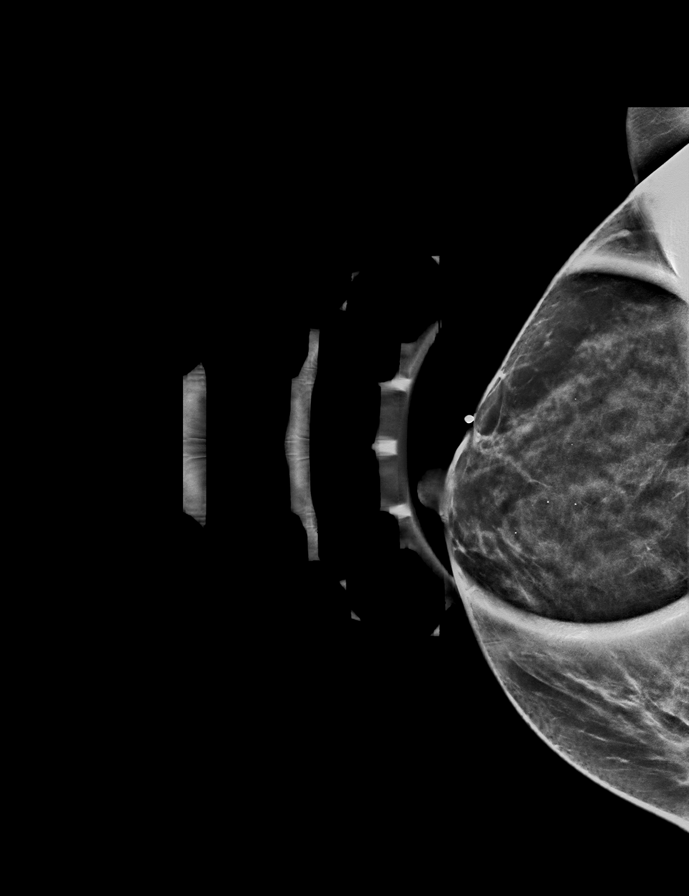

[L CC synth-2D]
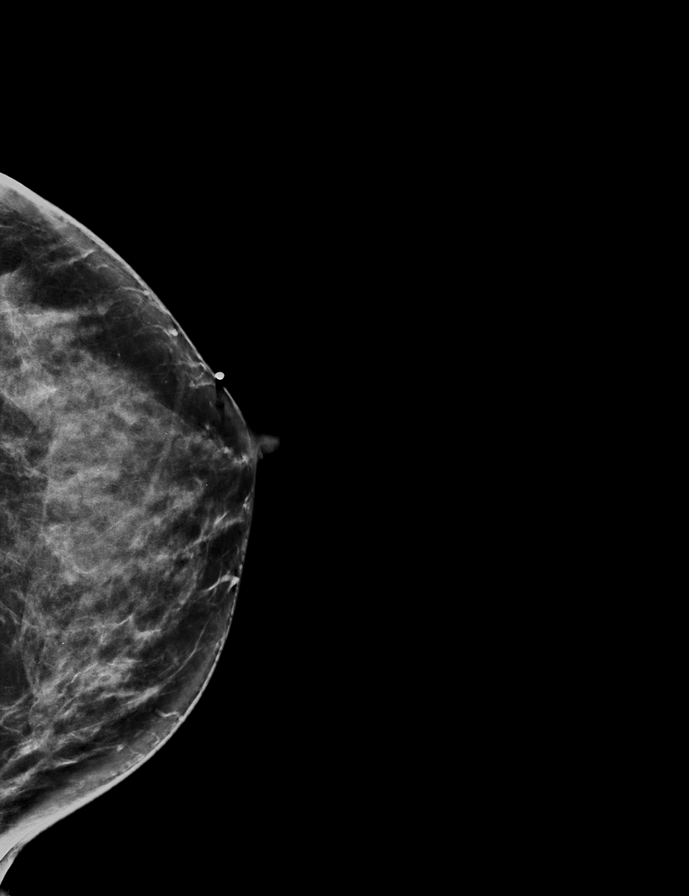

[L TAN synth-2D]
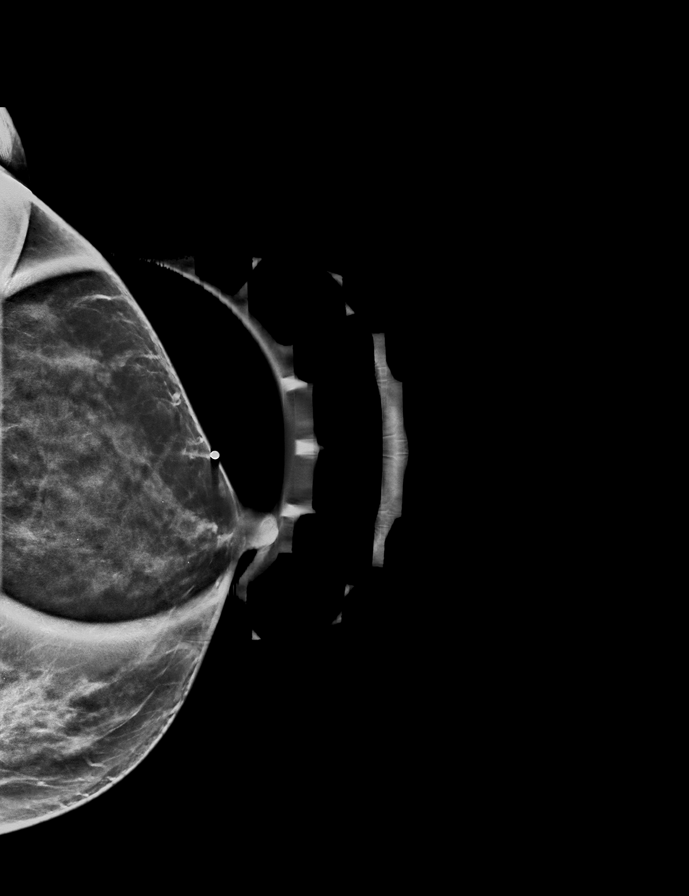

[R MLO synth-2D]
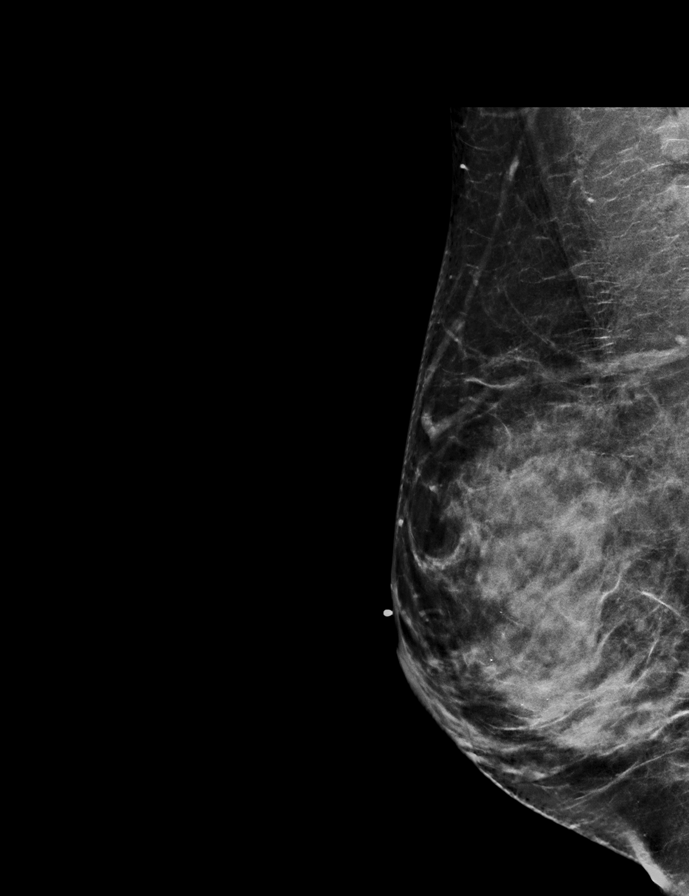

[R CC tomo · tomo slice 33/64.0]
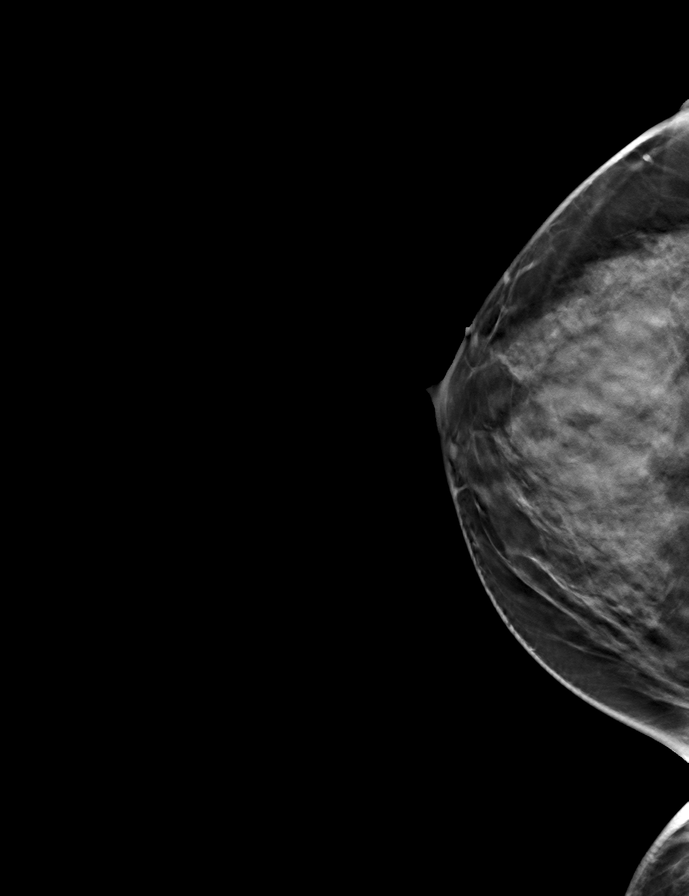

[8 of 40 positions shown; findings below may reference images not displayed]

ACR Breast Density Category d: The breast tissue is extremely dense,
which lowers the sensitivity of mammography.
FINDINGS: No suspicious calcifications, masses or areas of distortion are seen
in the bilateral breasts. No abnormal mammographic findings are
identified deep to the BB skin markers indicating the palpable sites
of concern.

Mammographic images were processed with CAD.

Ultrasound targeted to the palpable site in the upper-outer right
breast demonstrates normal fibroglandular tissue. No suspicious
masses or areas of shadowing are identified.

Ultrasound targeted to the palpable site in the upper-outer left
breast demonstrates normal fibroglandular tissue. No suspicious
masses or areas of shadowing are identified.
IMPRESSION: 1. There are no mammographic or targeted sonographic abnormalities
in either breast to correspond with the palpable areas of concern
identified on clinical breast exam.

2.  No mammographic evidence of malignancy in the bilateral breasts.

RECOMMENDATION:
1. Clinical follow-up recommended for the palpable areas of concern
in the bilateral breasts. Any further workup should be based on
clinical grounds.

2. Screening mammogram at age 41 unless there are persistent or
intervening clinical concerns. (Code:[id number])

I have discussed the findings and recommendations with the patient.
If applicable, a reminder letter will be sent to the patient
regarding the next appointment.

BI-RADS CATEGORY  1: Negative.

## 2021-09-02 ENCOUNTER — Ambulatory Visit: Payer: Self-pay | Admitting: Dermatology

## 2021-09-24 ENCOUNTER — Telehealth: Payer: Self-pay

## 2021-09-24 ENCOUNTER — Ambulatory Visit: Payer: BC Managed Care – PPO | Admitting: Psychiatry

## 2021-09-24 DIAGNOSIS — F900 Attention-deficit hyperactivity disorder, predominantly inattentive type: Secondary | ICD-10-CM

## 2021-09-24 MED ORDER — METHYLPHENIDATE HCL ER 54 MG PO TB24: 54.0000 mg | ORAL_TABLET | Freq: Every day | ORAL | 0 refills | Status: DC

## 2021-09-24 MED ORDER — METHYLPHENIDATE HCL 5 MG PO TABS: 5.0000 mg | ORAL_TABLET | Freq: Every day | ORAL | 0 refills | Status: DC

## 2021-09-24 NOTE — Telephone Encounter (Signed)
I have sent methylphenidate-2 prescriptions for both dosages to pharmacy.

## 2021-09-24 NOTE — Telephone Encounter (Signed)
pt called states that she does not have enough medicaitons to get to her next appt.

## 2021-10-07 ENCOUNTER — Telehealth: Payer: Self-pay | Admitting: *Deleted

## 2021-10-07 NOTE — Telephone Encounter (Signed)
Patient does have a script that was sent out with date specified to fill on or after 09/28/2021 . Please advise her to check with her pharmacy.

## 2021-10-07 NOTE — Telephone Encounter (Signed)
PATIENT CALLED REQUESTING REFILLS.NOTED THAT SHE HAS MISSED A APPT & HAS BEEN RESCHEDULED FOR 1/25 @ 4PM

## 2021-10-08 ENCOUNTER — Ambulatory Visit: Payer: BC Managed Care – PPO | Admitting: Dermatology

## 2021-10-08 ENCOUNTER — Other Ambulatory Visit: Payer: Self-pay

## 2021-10-08 ENCOUNTER — Encounter: Payer: Self-pay | Admitting: Dermatology

## 2021-10-08 DIAGNOSIS — D171 Benign lipomatous neoplasm of skin and subcutaneous tissue of trunk: Secondary | ICD-10-CM

## 2021-10-08 DIAGNOSIS — D1730 Benign lipomatous neoplasm of skin and subcutaneous tissue of unspecified sites: Secondary | ICD-10-CM

## 2021-10-08 DIAGNOSIS — M67441 Ganglion, right hand: Secondary | ICD-10-CM | POA: Diagnosis not present

## 2021-10-08 DIAGNOSIS — M67449 Ganglion, unspecified hand: Secondary | ICD-10-CM

## 2021-10-08 DIAGNOSIS — D2371 Other benign neoplasm of skin of right lower limb, including hip: Secondary | ICD-10-CM | POA: Diagnosis not present

## 2021-10-08 DIAGNOSIS — D2361 Other benign neoplasm of skin of right upper limb, including shoulder: Secondary | ICD-10-CM

## 2021-10-08 DIAGNOSIS — D239 Other benign neoplasm of skin, unspecified: Secondary | ICD-10-CM

## 2021-10-08 DIAGNOSIS — L578 Other skin changes due to chronic exposure to nonionizing radiation: Secondary | ICD-10-CM

## 2021-10-08 DIAGNOSIS — L820 Inflamed seborrheic keratosis: Secondary | ICD-10-CM

## 2021-10-08 DIAGNOSIS — D229 Melanocytic nevi, unspecified: Secondary | ICD-10-CM

## 2021-10-08 DIAGNOSIS — Z86018 Personal history of other benign neoplasm: Secondary | ICD-10-CM

## 2021-10-08 DIAGNOSIS — L821 Other seborrheic keratosis: Secondary | ICD-10-CM

## 2021-10-08 DIAGNOSIS — L814 Other melanin hyperpigmentation: Secondary | ICD-10-CM

## 2021-10-08 DIAGNOSIS — Z1283 Encounter for screening for malignant neoplasm of skin: Secondary | ICD-10-CM

## 2021-10-08 DIAGNOSIS — D1800 Hemangioma unspecified site: Secondary | ICD-10-CM

## 2021-10-08 NOTE — Progress Notes (Addendum)
New Patient Visit  Subjective  Marilyn Norris is a 41 y.o. female who presents for the following: check spots (Groin, abdomen, >42m, irritating, itchy), fatty mole (Buttocks, ), Cyst (R middle finger, itchy, stingy), and Total body skin exam (Hx of Dysplastic nevi). She complains of itchy spots of trunk that she scratches and bleed. The patient presents for Total-Body Skin Exam (TBSE) for skin cancer screening and mole check.  The patient has spots, moles and lesions to be evaluated, some may be new or changing and the patient has concerns that these could be cancer.  The following portions of the chart were reviewed this encounter and updated as appropriate:   Tobacco   Allergies   Meds   Problems   Med Hx   Surg Hx   Fam Hx      Review of Systems:  No other skin or systemic complaints except as noted in HPI or Assessment and Plan.  Objective  Well appearing patient in no apparent distress; mood and affect are within normal limits.  A full examination was performed including scalp, head, eyes, ears, nose, lips, neck, chest, axillae, abdomen, back, buttocks, bilateral upper extremities, bilateral lower extremities, hands, feet, fingers, toes, fingernails, and toenails. All findings within normal limits unless otherwise noted below.  R tricep, R med knee 0.5cm dimpling brown macule R tricep Dimpling brown macule R med knee  R 3rd finger Cystic pap  L medial buttocks Flesh colored pap  abdomen/sides x 30 (30) Stuck on waxy paps with erythema  Assessment & Plan   Lentigines - Scattered tan macules - Due to sun exposure - Benign-appearing, observe - Recommend daily broad spectrum sunscreen SPF 30+ to sun-exposed areas, reapply every 2 hours as needed. - Call for any changes  Seborrheic Keratoses - Stuck-on, waxy, tan-brown papules and/or plaques  - Benign-appearing - Discussed benign etiology and prognosis. - Observe - Call for any changes  Melanocytic Nevi -  Tan-brown and/or pink-flesh-colored symmetric macules and papules - Benign appearing on exam today - Observation - Call clinic for new or changing moles - Recommend daily use of broad spectrum spf 30+ sunscreen to sun-exposed areas.   Hemangiomas - Red papules - Discussed benign nature - Observe - Call for any changes  Actinic Damage - Chronic condition, secondary to cumulative UV/sun exposure - diffuse scaly erythematous macules with underlying dyspigmentation - Recommend daily broad spectrum sunscreen SPF 30+ to sun-exposed areas, reapply every 2 hours as needed.  - Staying in the shade or wearing long sleeves, sun glasses (UVA+UVB protection) and wide brim hats (4-inch brim around the entire circumference of the hat) are also recommended for sun protection.  - Call for new or changing lesions.  Skin cancer screening performed today.  History of Dysplastic Nevi - L mid to upper back 4.0cm lat to spine, R inf breast, R med sup pubic area - No evidence of recurrence today - Recommend regular full body skin exams - Recommend daily broad spectrum sunscreen SPF 30+ to sun-exposed areas, reapply every 2 hours as needed.  - Call if any new or changing lesions are noted between office visits   Dermatofibroma R tricep, R med knee (knee is DF vs nevus) Bx proven dermatofibroma on the R tricep 01/11/2019 Benign-appearing.  Observation.  Call clinic for new or changing lesions.  Recommend daily use of broad spectrum spf 30+ sunscreen to sun-exposed areas.   Digital mucous cyst R 3rd finger Vs other symptomatic Benign appearing, Recommend pt seeing a  hand surgeon or orthopedic surgeon, will refer pt to [facility] Ambulatory referral to Orthopedic Surgery  Fibrolipoma of skin L medial buttocks Benign appearing, observe Discussed surgical option.  Inflamed seborrheic keratosis (30) abdomen/sides x 30  Destruction of lesion - abdomen/sides x 30 Complexity:  simple   Destruction method: cryotherapy   Informed consent: discussed and consent obtained   Timeout:  patient name, date of birth, surgical site, and procedure verified Lesion destroyed using liquid nitrogen: Yes   Region frozen until ice ball extended beyond lesion: Yes   Outcome: patient tolerated procedure well with no complications   Post-procedure details: wound care instructions given    Skin cancer screening  Return in about 1 year (around 10/08/2022) for TBSE, Hx of Dysplastic nevi.  I, Othelia Pulling, RMA, am acting as scribe for Sarina Ser, MD . Documentation: I have reviewed the above documentation for accuracy and completeness, and I agree with the above.  Sarina Ser, MD

## 2021-10-08 NOTE — Patient Instructions (Signed)

## 2021-10-12 NOTE — Addendum Note (Signed)
Addended by: Ralene Bathe on: 10/12/2021 12:01 PM   Modules accepted: Level of Service

## 2021-10-14 ENCOUNTER — Ambulatory Visit (INDEPENDENT_AMBULATORY_CARE_PROVIDER_SITE_OTHER): Payer: BC Managed Care – PPO | Admitting: Psychiatry

## 2021-10-14 ENCOUNTER — Encounter: Payer: Self-pay | Admitting: Psychiatry

## 2021-10-14 ENCOUNTER — Other Ambulatory Visit: Payer: Self-pay

## 2021-10-14 VITALS — BP 124/79 | HR 94 | Temp 97.1°F | Wt 154.0 lb

## 2021-10-14 DIAGNOSIS — F900 Attention-deficit hyperactivity disorder, predominantly inattentive type: Secondary | ICD-10-CM | POA: Diagnosis not present

## 2021-10-14 DIAGNOSIS — F09 Unspecified mental disorder due to known physiological condition: Secondary | ICD-10-CM | POA: Diagnosis not present

## 2021-10-14 DIAGNOSIS — Z8659 Personal history of other mental and behavioral disorders: Secondary | ICD-10-CM

## 2021-10-14 MED ORDER — METHYLPHENIDATE HCL 10 MG PO TABS: 10.0000 mg | ORAL_TABLET | Freq: Every day | ORAL | 0 refills | Status: DC

## 2021-10-14 NOTE — Progress Notes (Signed)
[facility] MD OP Progress Note  10/14/2021 5:34 PM Marilyn Norris  MRN:  [id number]  Chief Complaint:  Chief Complaint   Follow-up 41 year old Caucasian female with history of ADHD, cognitive disorder, history of depression was evaluated in office today for medication management.    HPI: Marilyn Norris is a 41 year old Caucasian female, employed, married, lives in [place], has a history of ADHD inattentive type, depression was evaluated in office today.  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  Patient reports she noticed that when she took the immediate release she felt better focused and would like to try immediate release instead.  Patient denies any side effects to the Concerta or the Ritalin.  Patient reports work-related anxiety however overall she has been coping okay.  Denies any significant depression symptoms.  Reports appetite is fair.  Reports sleep is good.  Patient denies any suicidality, homicidality or perceptual disturbances.  Patient with cognitive issues, difficulty learning, comprehending, word processing, retaining information and was referred for neuropsychological testing, has upcoming appointment scheduled.  Patient denies any other concerns today.   Visit Diagnosis:    ICD-10-CM   1. Attention deficit hyperactivity disorder (ADHD), predominantly inattentive type  F90.0 methylphenidate (RITALIN) 10 MG tablet    2. Cognitive disorder  F09     3. History of depression  Z86.59       Past Psychiatric History: Reviewed past psychiatric history from progress note on 06/26/2020.  Past trials of Adderall, Concerta.  Past Medical History:  Past Medical History:  Diagnosis Date   Adult ADHD    ADD   Dysplastic nevus 04/12/2018   R inf breast - mild   Dysplastic nevus 04/12/2018   L mid to upper back 4.0 cm lat to spine - severe, excision 11/14/2018   Dysplastic  nevus 01/11/2019   R med sup pubic area - mild    Past Surgical History:  Procedure Laterality Date   CESAREAN SECTION  07/03/2009   HERNIA REPAIR     TONSILLECTOMY AND 49     30-55 years old     Family Psychiatric History: Reviewed family psychiatric history from progress note on 06/26/2020.  Family History:  Family History  Problem Relation Age of Onset   Thyroid disease Mother    Cancer Father        Kidney    Thyroid disease Sister    Bipolar disorder Maternal Uncle    Breast cancer Neg Hx     Social History: Reviewed social history from progress note on 06/26/2020. Social History   Socioeconomic History   Marital status: Married    Spouse name: Not on file   Number of children: Not on file   Years of education: Not on file   Highest education level: Not on file  Occupational History   Not on file  Tobacco Use   Smoking status: Never   Smokeless tobacco: Never  Vaping Use   Vaping Use: Never used  Substance and Sexual Activity   Alcohol use: Yes    Comment: SOCIAL   Drug use: No   Sexual activity: Yes    Birth control/protection: I.U.D.  Other Topics Concern   Not on file  Social History Narrative   Not on file   Social Determinants of Health   Financial Resource Strain: Not on file  Food Insecurity: Not on file  Transportation Needs: Not on file  Physical Activity: Not on file  Stress: Not on file  Social Connections: Not on file    Allergies:  Allergies  Allergen Reactions   Morphine Itching   Morphine And Related Itching    Metabolic Disorder Labs: No results found for: HGBA1C, MPG No results found for: PROLACTIN No results found for: CHOL, TRIG, HDL, CHOLHDL, VLDL, LDLCALC Lab Results  Component Value Date   TSH 1.070 09/12/2017    Therapeutic Level Labs: No results found for: LITHIUM No results found for: VALPROATE No components found for:  CBMZ  Current Medications: Current Outpatient Medications  Medication Sig  Dispense Refill   hydrocortisone 2.5 % cream Apply topically.     levonorgestrel (KYLEENA) 19.5 MG IUD by Intrauterine route.     methylphenidate (RITALIN) 10 MG tablet Take 1 tablet (10 mg total) by mouth daily. Take daily at 6 AM 21 tablet 0   methylphenidate (RITALIN) 5 MG tablet Take 1 tablet (5 mg total) by mouth daily in the afternoon. Start taking daily at 3 pm 90 tablet 0   nitrofurantoin, macrocrystal-monohydrate, (MACROBID) 100 MG capsule Take 1 capsule (100 mg total) by mouth 2 (two) times daily. 14 capsule 1   Levonorgestrel (KYLEENA) 19.5 MG IUD 1 Device by Intrauterine route once for 1 dose. 1 Intra Uterine Device 0   No current facility-administered medications for this visit.     Musculoskeletal: Strength & Muscle Tone: within normal limits Gait & Station: normal Patient leans: N/A  Psychiatric Specialty Exam: Review of Systems  Psychiatric/Behavioral:  Positive for decreased concentration. Negative for agitation, behavioral problems, confusion, dysphoric mood, hallucinations, self-injury, sleep disturbance and suicidal ideas. The patient is not nervous/anxious and is not hyperactive.   All other systems reviewed and are negative.  Blood pressure 124/79, pulse 94, temperature (!) 97.1 F (36.2 C), temperature source Temporal, weight 154 lb (69.9 kg).Body mass index is 26.43 kg/m.  General Appearance: Casual  Eye Contact:  Fair  Speech:  Clear and Coherent  Volume:  Normal  Mood:  Euthymic  Affect:  Congruent  Thought Process:  Goal Directed and Descriptions of Associations: Intact  Orientation:  Full (Time, Place, and Person)  Thought Content: Logical   Suicidal Thoughts:  No  Homicidal Thoughts:  No  Memory:  Immediate;   Fair Recent;   Fair Remote;   Fair  Judgement:  Fair  Insight:  Fair  Psychomotor Activity:  Normal  Concentration:  Concentration: Fair and Attention Span: Fair  Recall:  [employer] of Knowledge: Fair  Language: Fair  Akathisia:  No   Handed:  Right  AIMS (if indicated): done, 0  Assets:  Communication Skills Desire for [place] Talents/Skills Transportation Vocational/Educational  ADL's:  Intact  Cognition: WNL  Sleep:  Fair   Screenings: PHQ2-9    [place] Office Visit from 10/14/2021 in [place] Office Visit from 06/30/2021 in [place] Office Visit from 03/31/2021 in [place] Video Visit from 12/25/2020 in [place] Office Visit from [facility]/29/2020 in [facility]  PHQ-2 Total Score 0 0 0 0 0  PHQ-9 Total Score -- 3 -- -- 11      Flowsheet Row Video Visit from 12/25/2020 in [place] No Risk        Assessment and Plan: Marilyn Norris is a 41 year old Caucasian female, employed, married, has a history of ADHD was evaluated in office today.  Patient continues to struggle with attention and focus, retaining information, word processing, and does not believe Concerta is beneficial.  Will benefit from the following changes.   Plan ADHD inattentive type-unstable Discontinue Concerta. Start Ritalin 10 mg p.o. daily in the morning and 5 mg p.o. daily in the afternoon. Patient could also divide the Ritalin 10 mg into 5 mg and take it at 6 AM and second dose at 10 AM and the third dose of 5 mg in the afternoon as needed. Reviewed [place] PMP aware Was able to contact pharmacy while patient was in session today, spoke to the pharmacist at [facility]-discussed to cancel her Concerta prescription sent over on 09/28/2021.  Also discussed patient's concerns that she never got a notification from pharmacy that her prescription was sent over.  As per pharmacist patient will have to sign up for notification through text message.  Patient  educated.  Cognitive disorder-unstable Patient has been referred for  neuropsychological testing. Pending labs-TSH-patient has been noncompliant.  History of depression-Will monitor closely  Follow-up in clinic in 2 to 3 weeks or sooner if needed.  This note was generated in part or whole with voice recognition software. Voice recognition is usually quite accurate but there are transcription errors that can and very often do occur. I apologize for any typographical errors that were not detected and corrected.     Ursula Alert, MD 10/15/2021, 9:38 AM

## 2021-10-15 ENCOUNTER — Telehealth: Payer: Self-pay

## 2021-10-15 NOTE — Telephone Encounter (Signed)
received email that a prior auth was needed for the methylphenidate 10mg 

## 2021-10-15 NOTE — Telephone Encounter (Signed)
went online to [email or web address] and submitted the prior auth . - pending 

## 2021-10-22 NOTE — Telephone Encounter (Signed)
prior auth approved from 10-20-21 to  10-20-24

## 2021-11-03 ENCOUNTER — Telehealth (INDEPENDENT_AMBULATORY_CARE_PROVIDER_SITE_OTHER): Payer: BC Managed Care – PPO | Admitting: Psychiatry

## 2021-11-03 ENCOUNTER — Encounter: Payer: Self-pay | Admitting: Psychiatry

## 2021-11-03 ENCOUNTER — Other Ambulatory Visit: Payer: Self-pay

## 2021-11-03 DIAGNOSIS — Z8659 Personal history of other mental and behavioral disorders: Secondary | ICD-10-CM | POA: Diagnosis not present

## 2021-11-03 DIAGNOSIS — F09 Unspecified mental disorder due to known physiological condition: Secondary | ICD-10-CM | POA: Diagnosis not present

## 2021-11-03 DIAGNOSIS — F900 Attention-deficit hyperactivity disorder, predominantly inattentive type: Secondary | ICD-10-CM

## 2021-11-03 MED ORDER — MYDAYIS 12.5 MG PO CP24: 12.5000 mg | ORAL_CAPSULE | Freq: Every day | ORAL | 0 refills | Status: DC

## 2021-11-03 NOTE — Progress Notes (Signed)
Virtual Visit via Video Note  I connected with Marilyn Norris on 11/03/21 at  4:30 PM EST by a video enabled telemedicine application and verified that I am speaking with the correct person using two identifiers.  Location Provider Location : [facility] Patient Location : Home  Participants: Patient , Provider    I discussed the limitations of evaluation and management by telemedicine and the availability of in person appointments. The patient expressed understanding and agreed to proceed.    I discussed the assessment and treatment plan with the patient. The patient was provided an opportunity to ask questions and all were answered. The patient agreed with the plan and demonstrated an understanding of the instructions.   The patient was advised to call back or seek an in-person evaluation if the symptoms worsen or if the condition fails to improve as anticipated.   [facility] MD OP Progress Note  11/03/2021 5:00 PM Marilyn Norris  MRN:  [id number]  Chief Complaint:  Chief Complaint  Patient presents with   Follow-up 41 year old Caucasian female with history of ADHD, cognitive disorder, history of depression was evaluated for medication management.   HPI: Marilyn Norris is a 41 year old Caucasian female, employed, married, lives in [place], has a history of ADHD inattentive type, depression was evaluated by telemedicine today.  Patient today reports she likes the Ritalin immediate release however she reports she has difficulty taking this medication as prescribed throughout the day due to her work schedule.  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  She reports she hence would like to change to Mydayis as discussed last visit.  She spoke to the pharmacist and is open to trying the coupon which might help with price reduction.  Patient reports otherwise she is currently doing fairly well.  Denies any significant mood lability or irritability.  Reports anxiety symptoms  are under control.  Reports appetite is fair.  Reports sleep is good.  She is planning to go out for dinner with her husband since it is Valentine's Day.  Feels happy since her 40 year old daughter gave her a rose as a gift for Valentine's Day.  Denies any suicidality, homicidality or perceptual disturbances.  Denies any other concerns today.    Visit Diagnosis:    ICD-10-CM   1. Attention deficit hyperactivity disorder (ADHD), predominantly inattentive type  F90.0 Amphet-Dextroamphet 3-Bead ER (MYDAYIS) 12.5 MG CP24    2. Cognitive disorder  F09     3. History of depression  Z86.59       Past Psychiatric History: Reviewed past psychiatric history from progress note on 06/26/2020.  Past trials of Adderall, Concerta.  Past Medical History:  Past Medical History:  Diagnosis Date   Adult ADHD    ADD   Dysplastic nevus 04/12/2018   R inf breast - mild   Dysplastic nevus 04/12/2018   L mid to upper back 4.0 cm lat to spine - severe, excision 11/14/2018   Dysplastic nevus 01/11/2019   R med sup pubic area - mild    Past Surgical History:  Procedure Laterality Date   CESAREAN SECTION  07/03/2009   HERNIA REPAIR     TONSILLECTOMY AND 49     75-86 years old     Family Psychiatric History: Reviewed family psychiatric history from progress note on 06/26/2020.  Family History:  Family History  Problem Relation Age of Onset   Thyroid disease Mother    Cancer Father  Kidney    Thyroid disease Sister    Bipolar disorder Maternal Uncle    Breast cancer Neg Hx     Social History: Reviewed social history from progress note on 06/26/2020. Social History   Socioeconomic History   Marital status: Married    Spouse name: Not on file   Number of children: Not on file   Years of education: Not on file   Highest education level: Not on file  Occupational History   Not on file  Tobacco Use   Smoking status: Never   Smokeless tobacco: Never  Vaping Use    Vaping Use: Never used  Substance and Sexual Activity   Alcohol use: Yes    Comment: SOCIAL   Drug use: No   Sexual activity: Yes    Birth control/protection: I.U.D.  Other Topics Concern   Not on file  Social History Narrative   Not on file   Social Determinants of Health   Financial Resource Strain: Not on file  Food Insecurity: Not on file  Transportation Needs: Not on file  Physical Activity: Not on file  Stress: Not on file  Social Connections: Not on file    Allergies:  Allergies  Allergen Reactions   Morphine Itching   Morphine And Related Itching    Metabolic Disorder Labs: No results found for: HGBA1C, MPG No results found for: PROLACTIN No results found for: CHOL, TRIG, HDL, CHOLHDL, VLDL, LDLCALC Lab Results  Component Value Date   TSH 1.070 09/12/2017    Therapeutic Level Labs: No results found for: LITHIUM No results found for: VALPROATE No components found for:  CBMZ  Current Medications: Current Outpatient Medications  Medication Sig Dispense Refill   Amphet-Dextroamphet 3-Bead ER (MYDAYIS) 12.5 MG CP24 Take 12.5 mg by mouth daily. Take daily at 7 AM 30 capsule 0   hydrocortisone 2.5 % cream Apply topically.     Levonorgestrel (KYLEENA) 19.5 MG IUD 1 Device by Intrauterine route once for 1 dose. 1 Intra Uterine Device 0   levonorgestrel (KYLEENA) 19.5 MG IUD by Intrauterine route.     nitrofurantoin, macrocrystal-monohydrate, (MACROBID) 100 MG capsule Take 1 capsule (100 mg total) by mouth 2 (two) times daily. 14 capsule 1   No current facility-administered medications for this visit.     Musculoskeletal: Strength & Muscle Tone:  UTA Gait & Station:  Seated Patient leans: N/A  Psychiatric Specialty Exam: Review of Systems  Psychiatric/Behavioral:  Positive for decreased concentration. Negative for agitation, sleep disturbance and suicidal ideas.   All other systems reviewed and are negative.  There were no vitals taken for this  visit.There is no height or weight on file to calculate BMI.  General Appearance: Casual  Eye Contact:  Fair  Speech:  Clear and Coherent  Volume:  Normal  Mood:  Euthymic  Affect:  Congruent  Thought Process:  Goal Directed and Descriptions of Associations: Intact  Orientation:  Full (Time, Place, and Person)  Thought Content: Logical   Suicidal Thoughts:  No  Homicidal Thoughts:  No  Memory:  Immediate;   Fair Recent;   Fair Remote;   Fair  Judgement:  Fair  Insight:  Fair  Psychomotor Activity:  Normal  Concentration:  Concentration: Fair and Attention Span: Fair  Recall:  [employer] of Knowledge: Fair  Language: Fair  Akathisia:  No  Handed:  Right  AIMS (if indicated): not done  Assets:  Communication Skills 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 Vocational/Educational  ADL's:  Intact  Cognition: WNL  Sleep:  Fair   Screenings: [place] Office Visit from 10/14/2021 in [place] Office Visit from 06/30/2021 in [place] Office Visit from 03/31/2021 in [place] Video Visit from 12/25/2020 in [place] Office Visit from 05/29/2020 in [facility]  PHQ-2 Total Score 0 0 0 0 0  PHQ-9 Total Score -- 3 -- -- 11      Flowsheet Row Video Visit from 12/25/2020 in [place] No Risk        Assessment and Plan: Tekoa Hamor is a 41 year old Caucasian female, employed, married, has a history of ADHD was evaluated by telemedicine today.  Patient is currently struggling with attention and focus and is unable to take the Ritalin/methylphenidate as prescribed.  Discussed plan as noted below.  Plan ADHD inattentive type-unstable Discontinue methylphenidate. Start Mydayis 12.5 mg p.o. daily.  Provided medication  education. Reviewed [place] PMP aware  Cognitive disorder-unstable Pending labs-TSH Referred for neuropsychological testing  History of depression-we will monitor closely.   Follow-up in clinic in 4 weeks or sooner if needed in person.  This note was generated in part or whole with voice recognition software. Voice recognition is usually quite accurate but there are transcription errors that can and very often do occur. I apologize for any typographical errors that were not detected and corrected.      Ursula Alert, MD 11/04/2021, 8:11 AM

## 2021-11-03 NOTE — Patient Instructions (Signed)
Amphetamine; Dextroamphetamine Extended-Release Capsules What is this medication? AMPHETAMINE; DEXTROAMPHETAMINE (am FET a meen; dex troe am FET a meen) treats attention-deficit hyperactivity disorder (ADHD). It works by improving focus and reducing impulsive behavior. It belongs to a group of medications called stimulants. This medicine may be used for other purposes; ask your health care provider or pharmacist if you have questions. COMMON BRAND NAME(S): Adderall XR, Mydayis What should I tell my care team before I take this medication? They need to know if you have any of these conditions: Anxiety or panic attacks Circulation problems in fingers and toes (Raynaud's disease) Glaucoma Heart attack Heart disease High blood pressure History of alcohol or drug abuse or addiction Kidney disease Liver disease Mental health disease Previous suicide attempt by you or a family member Seizures Stroke Suicidal thoughts, plans, or attempt Thyroid disease Tourette's syndrome An unusual or allergic reaction to dextroamphetamine, other amphetamines, other medications, foods, dyes, or preservatives Pregnant or trying to get pregnant Breast-feeding How should I use this medication? Take this medication by mouth with water. Take it as directed on the prescription label at the same time every day. You can take it with or without food. If it upsets your stomach, take it with food. Do not cut, crush, or chew this medication. Swallow the capsules whole. You may open the capsule and put the contents in 1 teaspoon of applesauce. Swallow the medication and applesauce right away. Do not chew the medication or applesauce. Keep taking it unless your care team tells you to stop. A special MedGuide will be given to you by the pharmacist with each prescription and refill. Be sure to read this information carefully each time. Talk to your care team about the use of this medication in children. While it may be  prescribed for children as young as 6 years for selected conditions, precautions do apply. Overdosage: If you think you have taken too much of this medicine contact a poison control center or emergency room at once. NOTE: This medicine is only for you. Do not share this medicine with others. What if I miss a dose? If you miss a dose, take it as soon as you can in the morning, but do not take it later in the day because it can cause trouble sleeping. If it is almost time for your next dose, take only that dose. Do not take double or extra doses. What may interact with this medication? Do not take this medication with any of the following: Linezolid MAOIs like Carbex, Eldepryl, Marplan, Nardil, and Parnate Methylene blue (injected into a vein) Other stimulant medications for attention disorders, weight loss, or to stay awake This medication may also interact with the following: Acetazolamide Ammonium chloride Ascorbic acid Atomoxetine Certain medications for blood pressure, heart disease, irregular heart beat Certain medications for depression, anxiety, or psychotic disturbances Cold or allergy medications Lithium Methenamine Narcotic medicines for pain Quinidine Ritonavir Sodium bicarbonate St. John's Wort Tryptophan This list may not describe all possible interactions. Give your health care provider a list of all the medicines, herbs, non-prescription drugs, or dietary supplements you use. Also tell them if you smoke, drink alcohol, or use illegal drugs. Some items may interact with your medicine. What should I watch for while using this medication? Visit your care team for regular checks on your progress. This prescription requires that you follow special procedures with your care team and pharmacy. You will need to have a new written prescription from your care team every time you  need a refill. This medication may affect your concentration, or hide signs of tiredness. Until you know  how this medication affects you, do not drive, ride a bicycle, use machinery, or do anything that needs mental alertness. Alcohol should be avoided with some brands of this medication. Talk to your care team if you have questions. Tell your care team if this medication loses its effects, or if you feel you need to take more than the prescribed amount. Do not change the dosage without talking to your care team. Decreased appetite is a common side effect when starting this medication. Eating small, frequent meals or snacks can help. Talk to your care team if you continue to have poor eating habits. Height and weight growth of a child taking this medication will be monitored closely. Do not take this medication close to bedtime. It may prevent you from sleeping. Tell your care team right away if you notice unexplained wounds on your fingers and toes while taking this medication. You should also tell your care team if you experience numbness or pain, changes in the skin color, or sensitivity to temperature in your fingers or toes. What side effects may I notice from receiving this medication? Side effects that you should report to your care team as soon as possible: Allergic reactions--skin rash, itching, hives, swelling of the face, lips, tongue, or throat Raynaud's--cool, numb, or painful fingers or toes that may change color from pale, to blue, to red Heart rhythm changes--fast or irregular heartbeat, dizziness, feeling faint or lightheaded, chest pain, trouble breathing Increase in blood pressure Mood and behavior changes--anxiety, nervousness, confusion, hallucinations, irritability, hostility, thoughts of suicide or self-harm, worsening mood, feelings of depression Painful or prolonged erections Seizures Stroke in adults--sudden numbness or weakness of the face, arm or leg, trouble speaking, confusion, trouble walking, loss of balance or coordination, dizziness, severe headache, change in  vision Side effects that usually do not require medical attention (report to your care team if they continue or are bothersome): Blurry vision Headache Loss of appetite Nausea Trouble sleeping Weight loss This list may not describe all possible side effects. Call your doctor for medical advice about side effects. You may report side effects to FDA at 1-800-FDA-1088. Where should I keep my medication? Keep out of the reach of children and pets. This medication can be abused. Keep it in a safe place to protect it from theft. Do not share it with anyone. It is only for you. Selling or giving away this medication is dangerous and against the law. Store at room temperature between 15 and 30 degrees C (59 and 86 degrees F). Protect from light and moisture. Keep container tightly closed. Get rid of any unused medication after the expiration date. This medication may cause harm and death if it is taken by other adults, children, or pets. It is important to get rid of the medication as soon as you no longer need it or it is expired. You can do this in two ways: Take the medication to a medication take-back program. Check with your pharmacy or law enforcement to find a location. If you cannot return the medication, check the label or package insert to see if the medication should be thrown out in the garbage or flushed down the toilet. If you are not sure, ask your care team. If it is safe to put it in the trash, take the medication out of the container. Mix the medication with cat litter, dirt, coffee grounds, or other  unwanted substance. Seal the mixture in a bag or container. Put it in the trash. NOTE: This sheet is a summary. It may not cover all possible information. If you have questions about this medicine, talk to your doctor, pharmacist, or health care provider.  2022 Elsevier/Gold Standard (2020-12-31 00:00:00)

## 2021-11-11 ENCOUNTER — Telehealth: Payer: Self-pay

## 2021-11-11 NOTE — Telephone Encounter (Signed)
received that prior auth is needed for the amphetamine - dextroamphetamine  submitted the prior auth and it was approved from 11-10-21 to 11-10-24

## 2021-11-20 ENCOUNTER — Telehealth: Payer: Self-pay

## 2021-11-20 NOTE — Telephone Encounter (Signed)
prior Josem Kaufmann was submitted for the mydayis and it was approved from 11-18-21 to 11-18-24 ?

## 2021-12-01 ENCOUNTER — Ambulatory Visit: Payer: BC Managed Care – PPO | Admitting: Psychiatry

## 2021-12-24 ENCOUNTER — Other Ambulatory Visit: Payer: Self-pay | Admitting: Psychiatry

## 2021-12-24 ENCOUNTER — Telehealth: Payer: Self-pay

## 2021-12-24 MED ORDER — MYDAYIS 25 MG PO CP24: 25.0000 mg | ORAL_CAPSULE | Freq: Every day | ORAL | 0 refills | Status: DC

## 2021-12-24 NOTE — Telephone Encounter (Signed)
Please ask her if she is interested in trying Mydayis 25 mg daily unless she has any side effect from the current medication.

## 2021-12-24 NOTE — Telephone Encounter (Signed)
she is ok with increase.  ?

## 2021-12-24 NOTE — Telephone Encounter (Signed)
Ordered. Will notify Dr. Shea Evans and will defer further refills until her appointment.

## 2021-12-24 NOTE — Telephone Encounter (Signed)
pt states she like to speak with a provider about increasing her mydayis.  she states that either way she needs a refill but she can tell that them medication is not really working that it needs to be more or she need something else that is in stock. ?

## 2022-01-19 ENCOUNTER — Telehealth: Payer: Self-pay | Admitting: Psychiatry

## 2022-01-26 ENCOUNTER — Encounter: Payer: Self-pay | Admitting: Psychiatry

## 2022-01-26 ENCOUNTER — Telehealth (INDEPENDENT_AMBULATORY_CARE_PROVIDER_SITE_OTHER): Payer: BC Managed Care – PPO | Admitting: Psychiatry

## 2022-01-26 DIAGNOSIS — Z8659 Personal history of other mental and behavioral disorders: Secondary | ICD-10-CM | POA: Diagnosis not present

## 2022-01-26 DIAGNOSIS — F900 Attention-deficit hyperactivity disorder, predominantly inattentive type: Secondary | ICD-10-CM

## 2022-01-26 DIAGNOSIS — F09 Unspecified mental disorder due to known physiological condition: Secondary | ICD-10-CM

## 2022-01-26 MED ORDER — MYDAYIS 37.5 MG PO CP24: 37.5000 mg | ORAL_CAPSULE | Freq: Every day | ORAL | 0 refills | Status: DC

## 2022-01-26 NOTE — Progress Notes (Signed)
Virtual Visit via Video Note ? ?I connected with [name] on 01/26/22 at  4:20 PM EDT by a video enabled telemedicine application and verified that I am speaking with the correct person using two identifiers. ? ?Location ?Provider Location : [facility] ?Patient Location : Work ? ?Participants: Patient , Provider ? ?  ?I discussed the limitations of evaluation and management by telemedicine and the availability of in person appointments. The patient expressed understanding and agreed to proceed. ?  ?I discussed the assessment and treatment plan with the patient. The patient was provided an opportunity to ask questions and all were answered. The patient agreed with the plan and demonstrated an understanding of the instructions. ?  ?The patient was advised to call back or seek an in-person evaluation if the symptoms worsen or if the condition fails to improve as anticipated. ? ? ?[facility] MD OP Progress Note ? ?01/26/2022 4:46 PM ?[name]  ?MRN:  [id number] ? ?Chief Complaint:  ?Chief Complaint  ?Patient presents with  ? Follow-up: 41 year old Caucasian female with history of ADHD, cognitive disorder, history of depression was evaluated for medication management.  ? ?HPI: Takako Minckler is a 41 year old Caucasian female, employed, married, lives in [place], has a history of ADHD, inattentive type, depression was evaluated by telemedicine today. ? ?Patient today reports she is currently tolerating Mydayis 25 mg.  She has not had any significant anxiety, sleep problems or appetite changes since being on the higher dosage. ? ?Patient reports some Mydayis 25 mg works better than the 12.5 or she continues to feel she needs an extra push with regards to her motivation and focus throughout the day.  She has been self-medicating herself with caffeine to help with this.  She is interested in dosage increase. ? ?Patient reports work is going well.  She stays busy. ? ?Denies suicidality, homicidality or perceptual  disturbances. ? ? ? ?Visit Diagnosis:  ?  ICD-10-CM   ?1. Attention deficit hyperactivity disorder (ADHD), predominantly inattentive type  F90.0 Amphet-Dextroamphet 3-Bead ER (MYDAYIS) 37.5 MG CP24  ?  ?2. Cognitive disorder  F09   ?  ?3. History of depression  Z86.59   ?  ? ? ?Past Psychiatric History: Reviewed past psychiatric history from progress note on 06/26/2020.  Past trials of Adderall, Concerta, Ritalin ? ?Past Medical History:  ?Past Medical History:  ?Diagnosis Date  ? Adult ADHD   ? ADD  ? Dysplastic nevus 04/12/2018  ? R inf breast - mild  ? Dysplastic nevus 04/12/2018  ? L mid to upper back 4.0 cm lat to spine - severe, excision 11/14/2018  ? Dysplastic nevus 01/11/2019  ? R med sup pubic area - mild  ?  ?Past Surgical History:  ?Procedure Laterality Date  ? CESAREAN SECTION  07/03/2009  ? HERNIA REPAIR    ? TONSILLECTOMY AND ADENOIDECTOMY    ? 51-44 years old   ? ? ?Family Psychiatric History: Reviewed family psychiatric history from progress note on 06/26/2020. ? ?Family History:  ?Family History  ?Problem Relation Age of Onset  ? Thyroid disease Mother   ? Cancer Father   ?     Kidney   ? Thyroid disease Sister   ? Bipolar disorder Maternal Uncle   ? Breast cancer Neg Hx   ? ? ?Social History: Reviewed social history from progress note on 06/26/2020. ?Social History  ? ?Socioeconomic History  ? Marital status: Married  ?  Spouse name: Not on file  ? Number of children: Not  on file  ? Years of education: Not on file  ? Highest education level: Not on file  ?Occupational History  ? Not on file  ?Tobacco Use  ? Smoking status: Never  ? Smokeless tobacco: Never  ?Vaping Use  ? Vaping Use: Never used  ?Substance and Sexual Activity  ? Alcohol use: Yes  ?  Comment: SOCIAL  ? Drug use: No  ? Sexual activity: Yes  ?  Birth control/protection: I.U.D.  ?Other Topics Concern  ? Not on file  ?Social History Narrative  ? Not on file  ? ?Social Determinants of Health  ? ?Financial Resource Strain: Not on file   ?Food Insecurity: Not on file  ?Transportation Needs: Not on file  ?Physical Activity: Not on file  ?Stress: Not on file  ?Social Connections: Not on file  ? ? ?Allergies:  ?Allergies  ?Allergen Reactions  ? Morphine Itching  ? Morphine And Related Itching  ? ? ?Metabolic Disorder Labs: ?No results found for: HGBA1C, MPG ?No results found for: PROLACTIN ?No results found for: CHOL, TRIG, HDL, CHOLHDL, VLDL, LDLCALC ?Lab Results  ?Component Value Date  ? TSH 1.070 09/12/2017  ? ? ?Therapeutic Level Labs: ?No results found for: LITHIUM ?No results found for: VALPROATE ?No components found for:  CBMZ ? ?Current Medications: ?Current Outpatient Medications  ?Medication Sig Dispense Refill  ? Amphet-Dextroamphet 3-Bead ER (MYDAYIS) 37.5 MG CP24 Take 37.5 mg by mouth daily. Take daily at 7 AM 30 capsule 0  ? hydrocortisone 2.5 % cream Apply topically.    ? Levonorgestrel (KYLEENA) 19.5 MG IUD 1 Device by Intrauterine route once for 1 dose. 1 Intra Uterine Device 0  ? levonorgestrel (KYLEENA) 19.5 MG IUD by Intrauterine route.    ? nitrofurantoin, macrocrystal-monohydrate, (MACROBID) 100 MG capsule Take 1 capsule (100 mg total) by mouth 2 (two) times daily. 14 capsule 1  ? ?No current facility-administered medications for this visit.  ? ? ? ?Musculoskeletal: ?Strength & Muscle Tone:  UTA ?Gait & Station:  Seated ?Patient leans: N/A ? ?Psychiatric Specialty Exam: ?Review of Systems  ?Psychiatric/Behavioral:  Positive for decreased concentration.   ?All other systems reviewed and are negative.  ?There were no vitals taken for this visit.There is no height or weight on file to calculate BMI.  ?General Appearance: Casual  ?Eye Contact:  Fair  ?Speech:  Clear and Coherent  ?Volume:  Normal  ?Mood:  Euthymic  ?Affect:  Congruent  ?Thought Process:  Goal Directed and Descriptions of Associations: Intact  ?Orientation:  Full (Time, Place, and Person)  ?Thought Content: Logical   ?Suicidal Thoughts:  No  ?Homicidal Thoughts:  No   ?Memory:  Immediate;   Fair ?Recent;   Fair ?Remote;   Fair  ?Judgement:  Fair  ?Insight:  Good  ?Psychomotor Activity:  Normal  ?Concentration:  Concentration: Fair and Attention Span: Fair  ?Recall:  Fair  ?Fund of Knowledge: Fair  ?Language: Fair  ?Akathisia:  No  ?Handed:  Right  ?AIMS (if indicated): not done  ?Assets:  Communication Skills ?Desire for Improvement ?Housing ?Social Support ?Talents/Skills ?Transportation  ?ADL's:  Intact  ?Cognition: WNL  ?Sleep:  Fair  ? ?Screenings: ?PHQ2-9   ? ?Flowsheet Row Video Visit from 01/26/2022 in [facility] Office Visit from 10/14/2021 in [facility] Office Visit from 06/30/2021 in [facility] Office Visit from 03/31/2021 in [facility] Video Visit from 12/25/2020 in [facility]  ?PHQ-2 Total Score 0 0 0  0 0  ?PHQ-9 Total Score -- -- 3 -- --  ? ?  ? ?Flowsheet Row Video Visit from 01/26/2022 in [facility] Video Visit from 12/25/2020 in [facility]  ?C-SSRS RISK CATEGORY No Risk No Risk  ? ?  ? ? ? ?Assessment and Plan: Teyla Skidgel is a 41 year old Caucasian female, employed, married, has a history of ADHD was evaluated by telemedicine today.  Patient is currently improving on the mild days however would like a dosage increase and she continues to have concentration problems.  Discussed plan as noted below. ? ?Plan ?ADHD inattentive type-improving ?Increase Mydayis to 37.5 mg p.o. daily in the morning ?Reviewed [place] PMP aware ? ?Cognitive disorder-improving ?Referred for neuropsychological testing ?Pending labs-TSH. ? ? ?Follow-up in clinic in 3 weeks or sooner in person. ? ? ?Consent: Patient/Guardian gives verbal consent for treatment and assignment of benefits for services provided during this visit. Patient/Guardian expressed understanding and agreed to proceed.   ? ?This note was generated in part or whole with voice recognition software. Voice recognition is usually quite accurate but there are transcription errors that can and very often do occur. I apologize for any t

## 2022-01-27 ENCOUNTER — Telehealth: Payer: Self-pay

## 2022-01-27 NOTE — Telephone Encounter (Signed)
went online and submitted the prior auth - pending ?

## 2022-01-27 NOTE — Telephone Encounter (Signed)
received fax from [email or web address] that a prior auth was needed for the mydayis.  ?

## 2022-01-28 NOTE — Telephone Encounter (Signed)
received fax thta mydayis was approved from 01-27-22 to 01-27-25 ?

## 2022-02-11 ENCOUNTER — Ambulatory Visit: Payer: BC Managed Care – PPO | Admitting: Psychiatry

## 2022-02-17 ENCOUNTER — Ambulatory Visit (INDEPENDENT_AMBULATORY_CARE_PROVIDER_SITE_OTHER): Payer: BC Managed Care – PPO | Admitting: Psychiatry

## 2022-02-17 ENCOUNTER — Encounter: Payer: Self-pay | Admitting: Psychiatry

## 2022-02-17 VITALS — BP 102/69 | HR 76 | Temp 98.8°F | Wt 149.2 lb

## 2022-02-17 DIAGNOSIS — Z8659 Personal history of other mental and behavioral disorders: Secondary | ICD-10-CM | POA: Diagnosis not present

## 2022-02-17 DIAGNOSIS — F900 Attention-deficit hyperactivity disorder, predominantly inattentive type: Secondary | ICD-10-CM

## 2022-02-17 MED ORDER — MYDAYIS 37.5 MG PO CP24: 37.5000 mg | ORAL_CAPSULE | Freq: Every day | ORAL | 0 refills | Status: DC

## 2022-02-17 NOTE — Progress Notes (Unsigned)
[facility] MD OP Progress Note  02/17/2022 4:57 PM Marilyn Norris  MRN:  [id number]  Chief Complaint:  Chief Complaint  Patient presents with   Follow-up: 41 year old Caucasian female with history of ADHD, cognitive disorder, depression was evaluated for medication management.   HPI: Marilyn Norris is a 41 year old Caucasian female, employed, married, lives in [place], has a history of ADHD, inattentive type, depression was evaluated in office today.  Patient today reports she is doing well on the Mydayis 37.5 mg.  She reports she likes the once a day dosage and it keeps her focused throughout the day.  She reports she used caffeine recently with her medication and had heart palpitations that day.  She stopped using caffeine during the day since then.  She has not had any heart palpitations after that.  Reports sleep and appetite is good.  She is going to start her summer break and looks forward to that.  Denies any depression or anxiety symptoms.  Denies any suicidality, homicidality or perceptual disturbances.  Patient denies any other concerns today.  Visit Diagnosis:    ICD-10-CM   1. Attention deficit hyperactivity disorder (ADHD), predominantly inattentive type  F90.0 Amphet-Dextroamphet 3-Bead ER (MYDAYIS) 37.5 MG CP24    Amphet-Dextroamphet 3-Bead ER (MYDAYIS) 37.5 MG CP24    Amphet-Dextroamphet 3-Bead ER (MYDAYIS) 37.5 MG CP24    2. History of depression  Z86.59       Past Psychiatric History: Reviewed past psychiatric history from progress note on 06/26/2020.  Past trials of Adderall, Concerta, Ritalin.  Past Medical History:  Past Medical History:  Diagnosis Date   Adult ADHD    ADD   Dysplastic nevus 04/12/2018   R inf breast - mild   Dysplastic nevus 04/12/2018   L mid to upper back 4.0 cm lat to spine - severe, excision 11/14/2018   Dysplastic nevus 01/11/2019   R med sup pubic area - mild    Past Surgical History:  Procedure Laterality Date   CESAREAN  SECTION  07/03/2009   HERNIA REPAIR     TONSILLECTOMY AND 30     69-18 years old     Family Psychiatric History: Reviewed family psychiatric history from progress note on 06/26/2020.  Family History:  Family History  Problem Relation Age of Onset   Thyroid disease Mother    Cancer Father        Kidney    Thyroid disease Sister    Bipolar disorder Maternal Uncle    Breast cancer Neg Hx     Social History: Reviewed social history from progress note on 06/26/2020. Social History   Socioeconomic History   Marital status: Married    Spouse name: Not on file   Number of children: Not on file   Years of education: Not on file   Highest education level: Not on file  Occupational History   Not on file  Tobacco Use   Smoking status: Never   Smokeless tobacco: Never  Vaping Use   Vaping Use: Never used  Substance and Sexual Activity   Alcohol use: Yes    Comment: SOCIAL   Drug use: No   Sexual activity: Yes    Birth control/protection: I.U.D.  Other Topics Concern   Not on file  Social History Narrative   Not on file   Social Determinants of Health   Financial Resource Strain: Not on file  Food Insecurity: Not on file  Transportation Needs: Not on file  Physical Activity: Not on file  Stress: Not on file  Social Connections: Not on file    Allergies:  Allergies  Allergen Reactions   Morphine Itching   Morphine And Related Itching    Metabolic Disorder Labs: No results found for: HGBA1C, MPG No results found for: PROLACTIN No results found for: CHOL, TRIG, HDL, CHOLHDL, VLDL, LDLCALC Lab Results  Component Value Date   TSH 1.070 09/12/2017    Therapeutic Level Labs: No results found for: LITHIUM No results found for: VALPROATE No components found for:  CBMZ  Current Medications: Current Outpatient Medications  Medication Sig Dispense Refill   [START ON 03/25/2022] Amphet-Dextroamphet 3-Bead ER (MYDAYIS) 37.5 MG CP24 Take 37.5 mg by mouth  daily. Take daily at 7 AM 30 capsule 0   [START ON 04/23/2022] Amphet-Dextroamphet 3-Bead ER (MYDAYIS) 37.5 MG CP24 Take 37.5 mg by mouth daily. Take daily at 7 AM 30 capsule 0   hydrocortisone 2.5 % cream Apply topically.     levonorgestrel (KYLEENA) 19.5 MG IUD by Intrauterine route.     nitrofurantoin, macrocrystal-monohydrate, (MACROBID) 100 MG capsule Take 1 capsule (100 mg total) by mouth 2 (two) times daily. 14 capsule 1   [START ON 02/25/2022] Amphet-Dextroamphet 3-Bead ER (MYDAYIS) 37.5 MG CP24 Take 37.5 mg by mouth daily. Take daily at 7 AM 30 capsule 0   Levonorgestrel (KYLEENA) 19.5 MG IUD 1 Device by Intrauterine route once for 1 dose. 1 Intra Uterine Device 0   No current facility-administered medications for this visit.     Musculoskeletal: Strength & Muscle Tone: within normal limits Gait & Station: normal Patient leans: N/A  Psychiatric Specialty Exam: Review of Systems  Psychiatric/Behavioral: Negative.    All other systems reviewed and are negative.  Blood pressure 102/69, pulse 76, temperature 98.8 F (37.1 C), temperature source Temporal, weight 149 lb 3.2 oz (67.7 kg).Body mass index is 25.61 kg/m.  General Appearance: Casual  Eye Contact:  Fair  Speech:  Clear and Coherent  Volume:  Normal  Mood:  Euthymic  Affect:  Congruent  Thought Process:  Goal Directed and Descriptions of Associations: Intact  Orientation:  Full (Time, Place, and Person)  Thought Content: Logical   Suicidal Thoughts:  No  Homicidal Thoughts:  No  Memory:  Immediate;   Fair Recent;   Fair Remote;   Fair  Judgement:  Fair  Insight:  Fair  Psychomotor Activity:  Normal  Concentration:  Concentration: Fair and Attention Span: Fair  Recall:  [employer] of Knowledge: Fair  Language: Fair  Akathisia:  No  Handed:  Right  AIMS (if indicated): not done  Assets:  Communication Skills Desire for Improvement Housing Intimacy Leisure Time Physical Health Social Support Talents/Skills   ADL's:  Intact  Cognition: WNL  Sleep:  Fair   Screenings: [facility] Office Visit from 02/17/2022 in [facility] Video Visit from 01/26/2022 in [facility] Office Visit from 10/14/2021 in [facility] Office Visit from 06/30/2021 in [facility] Office Visit from 03/31/2021 in [facility]  PHQ-2 Total Score 0 0 0 0 0  PHQ-9 Total Score 1 -- -- 3 --      Flowsheet Row Video Visit from 01/26/2022 in [facility] Video Visit from 12/25/2020 in [facility] No Risk No Risk        Assessment and Plan: Azizah Lisle is a 41 year old Caucasian female, employed, married, has a history  of ADHD was evaluated in office today.  Patient is currently stable.  Plan ADHD inattentive type-stable Mydayis 37.5 mg p.o. daily in the morning. Provided 3 prescriptions with date specified. Reviewed [place] PMP aware.  Follow-up in clinic in 3 months or sooner if needed.  This note was generated in part or whole with voice recognition software. Voice recognition is usually quite accurate but there are transcription errors that can and very often do occur. I apologize for any typographical errors that were not detected and corrected.      Ursula Alert, MD 02/17/2022, 4:57 PM

## 2022-03-25 ENCOUNTER — Encounter: Payer: BC Managed Care – PPO | Attending: Psychology | Admitting: Psychology

## 2022-04-02 ENCOUNTER — Telehealth: Payer: Self-pay

## 2022-04-02 DIAGNOSIS — F900 Attention-deficit hyperactivity disorder, predominantly inattentive type: Secondary | ICD-10-CM

## 2022-04-02 NOTE — Telephone Encounter (Signed)
Attempted to contact patient, left a voicemail.

## 2022-04-02 NOTE — Telephone Encounter (Signed)
pt called states that her insurance sent her a letter that they would not cover the mydayis anymore because it is to costly. she states that even using the saving card that it was going to cost $263.  she needs something else.   Pt last seen on 02-17-22.  Asked front desk to call and make a follow up appt since there was nothing showing in the system.

## 2022-04-05 MED ORDER — METHYLPHENIDATE HCL 5 MG PO TABS: 5.0000 mg | ORAL_TABLET | Freq: Every day | ORAL | 0 refills | Status: DC

## 2022-04-05 MED ORDER — METHYLPHENIDATE HCL ER (OSM) 54 MG PO TBCR: 54.0000 mg | EXTENDED_RELEASE_TABLET | ORAL | 0 refills | Status: DC

## 2022-04-05 NOTE — Telephone Encounter (Signed)
pt left message that she was returning your call.

## 2022-04-05 NOTE — Telephone Encounter (Signed)
Returned call to patient.  Unable to afford Mydayis. She would like to go back on the Concerta 54 mg and the Ritalin 5 mg she was taking previously.  Provided her refills for a month.  Advised to contact the office back tomorrow morning to schedule a follow-up appointment in 3 to 4 weeks, in person visit.  Patient agreed with the plan.  I have started her on Concerta 54 mg and Ritalin 5 mg-sent to pharmacy for a month.  Will discontinue Mydayis.

## 2022-05-03 ENCOUNTER — Telehealth: Payer: Self-pay

## 2022-05-03 DIAGNOSIS — F900 Attention-deficit hyperactivity disorder, predominantly inattentive type: Secondary | ICD-10-CM

## 2022-05-03 MED ORDER — METHYLPHENIDATE HCL 5 MG PO TABS: 5.0000 mg | ORAL_TABLET | Freq: Every day | ORAL | 0 refills | Status: DC

## 2022-05-03 MED ORDER — METHYLPHENIDATE HCL ER (OSM) 54 MG PO TBCR: 54.0000 mg | EXTENDED_RELEASE_TABLET | ORAL | 0 refills | Status: DC

## 2022-05-03 NOTE — Telephone Encounter (Signed)
I have sent methylphenidate 54 mg and 5 mg tablet to [facility] in [place].

## 2022-05-03 NOTE — Telephone Encounter (Signed)
message from pharmacy that patient does not have any refills on the methylphenidate and needs a new rx sent.

## 2022-05-13 ENCOUNTER — Telehealth (INDEPENDENT_AMBULATORY_CARE_PROVIDER_SITE_OTHER): Payer: BC Managed Care – PPO | Admitting: Psychiatry

## 2022-05-13 ENCOUNTER — Encounter: Payer: Self-pay | Admitting: Psychiatry

## 2022-05-13 DIAGNOSIS — Z8659 Personal history of other mental and behavioral disorders: Secondary | ICD-10-CM

## 2022-05-13 DIAGNOSIS — F900 Attention-deficit hyperactivity disorder, predominantly inattentive type: Secondary | ICD-10-CM | POA: Diagnosis not present

## 2022-05-13 MED ORDER — METHYLPHENIDATE HCL 5 MG PO TABS: 5.0000 mg | ORAL_TABLET | Freq: Every day | ORAL | 0 refills | Status: DC

## 2022-05-13 MED ORDER — METHYLPHENIDATE HCL ER (OSM) 54 MG PO TBCR: 54.0000 mg | EXTENDED_RELEASE_TABLET | ORAL | 0 refills | Status: DC

## 2022-05-13 NOTE — Progress Notes (Signed)
Virtual Visit via Video Note  I connected with Marilyn Norris on 05/13/22 at  1:00 PM EDT by a video enabled telemedicine application and verified that I am speaking with the correct person using two identifiers.  Location Provider Location : [facility] Patient Location : Work  Participants: Patient , Provider    I discussed the limitations of evaluation and management by telemedicine and the availability of in person appointments. The patient expressed understanding and agreed to proceed.    I discussed the assessment and treatment plan with the patient. The patient was provided an opportunity to ask questions and all were answered. The patient agreed with the plan and demonstrated an understanding of the instructions.   The patient was advised to call back or seek an in-person evaluation if the symptoms worsen or if the condition fails to improve as anticipated.   [place] MD OP Progress Note  05/13/2022 5:57 PM Marilyn Norris  MRN:  [id number]  Chief Complaint:  Chief Complaint  Patient presents with   Follow-up: 41 year old Caucasian female with history of ADHD, was evaluated for medication management.   HPI: Marilyn Norris is a 41 year old Caucasian female, employed, married, lives in [place], has a history of ADHD, inattentive type, depression was evaluated by telemedicine today.  Patient reports she is currently back at school.  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  Patient reports they found mold in several schools and that is why they have to remain closed until it is all cleared.  That does worry her since there has been a lot of changes that has been made to the entire curriculum.  Patient reports currently that the Concerta 54 mg and the Ritalin 5 mg is beneficial.  She takes the Concerta 54 mg regularly.  She uses the Ritalin only as needed.  Currently denies side effects.  Reports sleep and  appetite as fair.  Patient denies any suicidality, homicidality or perceptual disturbances.  Patient denies any other concerns today.  Visit Diagnosis:    ICD-10-CM   1. Attention deficit hyperactivity disorder (ADHD), predominantly inattentive type  F90.0 methylphenidate (RITALIN) 5 MG tablet    methylphenidate (CONCERTA) 54 MG PO CR tablet    2. History of depression  Z86.59       Past Psychiatric History: Reviewed past psychiatric history from progress note on 06/26/2020.  Past trials of Adderall, Concerta, Ritalin.  Past Medical History:  Past Medical History:  Diagnosis Date   Adult ADHD    ADD   Dysplastic nevus 04/12/2018   R inf breast - mild   Dysplastic nevus 04/12/2018   L mid to upper back 4.0 cm lat to spine - severe, excision 11/14/2018   Dysplastic nevus 01/11/2019   R med sup pubic area - mild    Past Surgical History:  Procedure Laterality Date   CESAREAN SECTION  07/03/2009   HERNIA REPAIR     TONSILLECTOMY AND 43     70-48 years old     Family Psychiatric History: Reviewed family psychiatric history from progress note on 06/26/2020.  Family History:  Family History  Problem Relation Age of Onset   Thyroid disease Mother    Cancer Father        Kidney    Thyroid disease Sister    Bipolar disorder Maternal Uncle    Breast cancer Neg Hx     Social History: Reviewed social history from progress  note on 06/26/2020. Social History   Socioeconomic History   Marital status: Married    Spouse name: Not on file   Number of children: Not on file   Years of education: Not on file   Highest education level: Not on file  Occupational History   Not on file  Tobacco Use   Smoking status: Never   Smokeless tobacco: Never  Vaping Use   Vaping Use: Never used  Substance and Sexual Activity   Alcohol use: Yes    Comment: SOCIAL   Drug use: No   Sexual activity: Yes    Birth control/protection: I.U.D.  Other Topics Concern   Not on file   Social History Narrative   Not on file   Social Determinants of Health   Financial Resource Strain: Not on file  Food Insecurity: Not on file  Transportation Needs: Not on file  Physical Activity: Not on file  Stress: Not on file  Social Connections: Not on file    Allergies:  Allergies  Allergen Reactions   Morphine Itching   Morphine And Related Itching    Metabolic Disorder Labs: No results found for: "HGBA1C", "MPG" No results found for: "PROLACTIN" No results found for: "CHOL", "TRIG", "HDL", "CHOLHDL", "VLDL", "LDLCALC" Lab Results  Component Value Date   TSH 1.070 09/12/2017    Therapeutic Level Labs: No results found for: "LITHIUM" No results found for: "VALPROATE" No results found for: "CBMZ"  Current Medications: Current Outpatient Medications  Medication Sig Dispense Refill   hydrocortisone 2.5 % cream Apply topically.     Levonorgestrel (KYLEENA) 19.5 MG IUD 1 Device by Intrauterine route once for 1 dose. 1 Intra Uterine Device 0   levonorgestrel (KYLEENA) 19.5 MG IUD by Intrauterine route.     [START ON 06/02/2022] methylphenidate (CONCERTA) 54 MG PO CR tablet Take 1 tablet (54 mg total) by mouth every morning. 30 tablet 0   [START ON 06/02/2022] methylphenidate (RITALIN) 5 MG tablet Take 1 tablet (5 mg total) by mouth daily after lunch. 30 tablet 0   nitrofurantoin, macrocrystal-monohydrate, (MACROBID) 100 MG capsule Take 1 capsule (100 mg total) by mouth 2 (two) times daily. 14 capsule 1   No current facility-administered medications for this visit.     Musculoskeletal: Strength & Muscle Tone:  UTA Gait & Station:  Seated Patient leans: N/A  Psychiatric Specialty Exam: Review of Systems  Psychiatric/Behavioral:  Positive for decreased concentration (Improving).   All other systems reviewed and are negative.   There were no vitals taken for this visit.There is no height or weight on file to calculate BMI.  General Appearance: Casual  Eye  Contact:  Fair  Speech:  Clear and Coherent  Volume:  Normal  Mood:  Euthymic  Affect:  Congruent  Thought Process:  Goal Directed and Descriptions of Associations: Intact  Orientation:  Full (Time, Place, and Person)  Thought Content: Logical   Suicidal Thoughts:  No  Homicidal Thoughts:  No  Memory:  Immediate;   Fair Recent;   Fair Remote;   Fair  Judgement:  Fair  Insight:  Fair  Psychomotor Activity:  Normal  Concentration:  Concentration: Fair and Attention Span: Fair  Recall:  [employer] of Knowledge: Fair  Language: Fair  Akathisia:  No  Handed:  Right  AIMS (if indicated): not done  Assets:  Communication Skills Desire for Improvement Housing Social Support  ADL's:  Intact  Cognition: WNL  Sleep:  Fair   Screenings: PHQ2-9    Flowsheet  Row Video Visit from 05/13/2022 in [facility] Office Visit from 02/17/2022 in [facility] Video Visit from 01/26/2022 in [facility] Office Visit from 10/14/2021 in [facility] Office Visit from 06/30/2021 in [facility]  PHQ-2 Total Score 0 0 0 0 0  PHQ-9 Total Score -- 1 -- -- 3      Flowsheet Row Video Visit from 05/13/2022 in [facility] Office Visit from 02/17/2022 in [facility] Video Visit from 01/26/2022 in [facility] No Risk No Risk No Risk        Assessment and Plan: Marilyn Norris is a 41 year old Caucasian female, employed, married, has a history of ADHD was evaluated by telemedicine today.  Patient is currently stable.  Plan ADHD inattentive type-stable Concerta 54 mg p.o. daily in the morning Ritalin 5 mg p.o. daily in the afternoon. I have provided one prescription with date specified. Reviewed [place] PMP aware Also provided patient with a smart phone  app-flora-green focus. Patient advised to establish care with a psychotherapist to help with organizing, being more structured.  Follow-up in clinic in 2 months or sooner if needed.   Collaboration of Care: Collaboration of Care: Referral or follow-up with counselor/therapist [facility] encouraged to establish care with therapist.  Patient/Guardian was advised Release of Information must be obtained prior to any record release in order to collaborate their care with an outside provider. Patient/Guardian was advised if they have not already done so to contact the registration department to sign all necessary forms in order for [place] to release information regarding their care.   Consent: Patient/Guardian gives verbal consent for treatment and assignment of benefits for services provided during this visit. Patient/Guardian expressed understanding and agreed to proceed.   This note was generated in part or whole with voice recognition software. Voice recognition is usually quite accurate but there are transcription errors that can and very often do occur. I apologize for any typographical errors that were not detected and corrected.      Ursula Alert, MD 05/13/2022, 5:57 PM

## 2022-05-14 NOTE — Telephone Encounter (Signed)
Error

## 2022-06-30 ENCOUNTER — Telehealth: Payer: Self-pay | Admitting: Psychiatry

## 2022-06-30 DIAGNOSIS — F900 Attention-deficit hyperactivity disorder, predominantly inattentive type: Secondary | ICD-10-CM

## 2022-06-30 MED ORDER — METHYLPHENIDATE HCL ER (OSM) 54 MG PO TBCR: 54.0000 mg | EXTENDED_RELEASE_TABLET | ORAL | 0 refills | Status: DC

## 2022-06-30 MED ORDER — METHYLPHENIDATE HCL 5 MG PO TABS: 5.0000 mg | ORAL_TABLET | Freq: Every day | ORAL | 0 refills | Status: DC

## 2022-06-30 NOTE — Telephone Encounter (Signed)
Patient left voice message stating that she could cancel her appointment for October if she was doing well. She is happy with the medication adjustment. But states that she will need a refill on he Adderal? Please advise

## 2022-06-30 NOTE — Telephone Encounter (Signed)
I have sent a month supply of her Ritalin and Concerta to pharmacy.  Patient will need an appointment prior to further refills.

## 2022-07-01 ENCOUNTER — Telehealth: Payer: BC Managed Care – PPO | Admitting: Psychiatry

## 2022-08-09 ENCOUNTER — Telehealth: Payer: Self-pay | Admitting: Psychiatry

## 2022-08-09 DIAGNOSIS — F900 Attention-deficit hyperactivity disorder, predominantly inattentive type: Secondary | ICD-10-CM

## 2022-08-09 MED ORDER — METHYLPHENIDATE HCL 5 MG PO TABS: 5.0000 mg | ORAL_TABLET | Freq: Every day | ORAL | 0 refills | Status: DC

## 2022-08-09 MED ORDER — METHYLPHENIDATE HCL ER (OSM) 54 MG PO TBCR: 54.0000 mg | EXTENDED_RELEASE_TABLET | ORAL | 0 refills | Status: DC

## 2022-08-09 NOTE — Telephone Encounter (Signed)
Patient scheduled appointment for January, but will need a refill on Concert 54 mg and the 5 mg. She is on the wait list for December for appt also

## 2022-08-09 NOTE — Telephone Encounter (Signed)
I have sent Concerta 54 mg and Ritalin 5 mg to pharmacy-2 prescriptions for each  with date specified.

## 2022-08-10 NOTE — Telephone Encounter (Signed)
Pt was called and notified

## 2022-08-11 ENCOUNTER — Telehealth: Payer: BC Managed Care – PPO | Admitting: Psychiatry

## 2022-09-09 ENCOUNTER — Telehealth: Payer: Self-pay | Admitting: Psychiatry

## 2022-09-09 NOTE — Telephone Encounter (Signed)
Called to reschedule patient appointment on 10-04-22 went out to Feb. 2024. Also stated that she is out of medication, Concerta 54 mg and Ritalin 5 mg. [facility] [address] [facility] [address] [facility]

## 2022-10-04 ENCOUNTER — Ambulatory Visit: Payer: BC Managed Care – PPO | Admitting: Psychiatry

## 2022-10-12 ENCOUNTER — Telehealth: Payer: Self-pay

## 2022-10-12 NOTE — Telephone Encounter (Signed)
Patient left voicemail stating that she needs a refill she did not state which medication called patient to get information about refill no answer and unable to leave a voicemail d/t voicemail has not been set up

## 2022-10-13 ENCOUNTER — Ambulatory Visit: Payer: BC Managed Care – PPO | Admitting: Dermatology

## 2022-10-13 DIAGNOSIS — D1801 Hemangioma of skin and subcutaneous tissue: Secondary | ICD-10-CM

## 2022-10-13 DIAGNOSIS — L821 Other seborrheic keratosis: Secondary | ICD-10-CM

## 2022-10-13 DIAGNOSIS — L578 Other skin changes due to chronic exposure to nonionizing radiation: Secondary | ICD-10-CM

## 2022-10-13 DIAGNOSIS — D229 Melanocytic nevi, unspecified: Secondary | ICD-10-CM

## 2022-10-13 DIAGNOSIS — L988 Other specified disorders of the skin and subcutaneous tissue: Secondary | ICD-10-CM

## 2022-10-13 DIAGNOSIS — Z1283 Encounter for screening for malignant neoplasm of skin: Secondary | ICD-10-CM

## 2022-10-13 DIAGNOSIS — L739 Follicular disorder, unspecified: Secondary | ICD-10-CM | POA: Diagnosis not present

## 2022-10-13 DIAGNOSIS — Z86018 Personal history of other benign neoplasm: Secondary | ICD-10-CM

## 2022-10-13 DIAGNOSIS — L814 Other melanin hyperpigmentation: Secondary | ICD-10-CM

## 2022-10-13 DIAGNOSIS — Z79899 Other long term (current) drug therapy: Secondary | ICD-10-CM

## 2022-10-13 MED ORDER — DOXYCYCLINE HYCLATE 20 MG PO TABS: 20.0000 mg | ORAL_TABLET | Freq: Two times a day (BID) | ORAL | 3 refills | Status: DC

## 2022-10-13 MED ORDER — MOMETASONE FUROATE 0.1 % EX CREA: 1.0000 | TOPICAL_CREAM | Freq: Every day | CUTANEOUS | 3 refills | Status: AC | PRN

## 2022-10-13 NOTE — Patient Instructions (Addendum)
Instructions for [facility] Medications  One or more of your medications was sent to the [facility] mail order compounding pharmacy. You will receive an email from them and can purchase the medicine through that link. It will then be mailed to your home at the address you confirmed. If for any reason you do not receive an email from them, please check your spam folder. If you still do not find the email, please let us know. [facility] phone number is [phone].       Due to recent changes in healthcare laws, you may see results of your pathology and/or laboratory studies on MyChart before the doctors have had a chance to review them. We understand that in some cases there may be results that are confusing or concerning to you. Please understand that not all results are received at the same time and often the doctors may need to interpret multiple results in order to provide you with the best plan of care or course of treatment. Therefore, we ask that you please give us 2 business days to thoroughly review all your results before contacting the office for clarification. Should we see a critical lab result, you will be contacted sooner.   If You Need Anything After Your Visit  If you have any questions or concerns for your doctor, please call our main line at [phone] and press option 4 to reach your doctor's medical assistant. If no one answers, please leave a voicemail as directed and we will return your call as soon as possible. Messages left after 4 pm will be answered the following business day.   You may also send us a message via MyChart. We typically respond to MyChart messages within 1-2 business days.  For prescription refills, please ask your pharmacy to contact our office. Our fax number is [phone].  If you have an urgent issue when the clinic is closed that cannot wait until the next business day, you can page your doctor at the number below.    Please note  that while we do our best to be available for urgent issues outside of office hours, we are not available 24/7.   If you have an urgent issue and are unable to reach us, you may choose to seek medical care at your doctor's office, retail clinic, urgent care center, or emergency room.  If you have a medical emergency, please immediately call 911 or go to the emergency department.  Pager Numbers  - Dr. Kowalski: [phone]  - Dr. Moye: [phone]  - Dr. Stewart: [phone]  In the event of inclement weather, please call our main line at [phone] for an update on the status of any delays or closures.  Dermatology Medication Tips: Please keep the boxes that topical medications come in in order to help keep track of the instructions about where and how to use these. Pharmacies typically print the medication instructions only on the boxes and not directly on the medication tubes.   If your medication is too expensive, please contact our office at [phone] option 4 or send us a message through MyChart.   We are unable to tell what your co-pay for medications will be in advance as this is different depending on your insurance coverage. However, we may be able to find a substitute medication at lower cost or fill out paperwork to get insurance to cover a needed medication.   If a prior authorization is required to get your medication covered by your insurance   company, please allow us 1-2 business days to complete this process.  Drug prices often vary depending on where the prescription is filled and some pharmacies may offer cheaper prices.  The website www.goodrx.com contains coupons for medications through different pharmacies. The prices here do not account for what the cost may be with help from insurance (it may be cheaper with your insurance), but the website can give you the price if you did not use any insurance.  - You can print the associated coupon and take it with your  prescription to the pharmacy.  - You may also stop by our office during regular business hours and pick up a GoodRx coupon card.  - If you need your prescription sent electronically to a different pharmacy, notify our office through [facility] MyChart or by phone at [phone] option 4.     Si Usted Necesita Algo Despus de Su Visita  Tambin puede enviarnos un mensaje a travs de MyChart. Por lo general respondemos a los mensajes de MyChart en el transcurso de 1 a 2 das hbiles.  Para renovar recetas, por favor pida a su farmacia que se ponga en contacto con nuestra oficina. Nuestro nmero de fax es el [phone].  Si tiene un asunto urgente cuando la clnica est cerrada y que no puede esperar hasta el siguiente da hbil, puede llamar/localizar a su doctor(a) al nmero que aparece a continuacin.   Por favor, tenga en cuenta que aunque hacemos todo lo posible para estar disponibles para asuntos urgentes fuera del horario de oficina, no estamos disponibles las 24 horas del da, los 7 das de la semana.   Si tiene un problema urgente y no puede comunicarse con nosotros, puede optar por buscar atencin mdica  en el consultorio de su doctor(a), en una clnica privada, en un centro de atencin urgente o en una sala de emergencias.  Si tiene una emergencia mdica, por favor llame inmediatamente al 911 o vaya a la sala de emergencias.  Nmeros de bper  - Dr. Kowalski: [phone]  - Dra. Moye: [phone]  - Dra. Stewart: [phone]  En caso de inclemencias del tiempo, por favor llame a nuestra lnea principal al [phone] para una actualizacin sobre el estado de cualquier retraso o cierre.  Consejos para la medicacin en dermatologa: Por favor, guarde las cajas en las que vienen los medicamentos de uso tpico para ayudarle a seguir las instrucciones sobre dnde y cmo usarlos. Las farmacias generalmente imprimen las instrucciones del medicamento slo en las cajas y no  directamente en los tubos del medicamento.   Si su medicamento es muy caro, por favor, pngase en contacto con nuestra oficina llamando al [phone] y presione la opcin 4 o envenos un mensaje a travs de MyChart.   No podemos decirle cul ser su copago por los medicamentos por adelantado ya que esto es diferente dependiendo de la cobertura de su seguro. Sin embargo, es posible que podamos encontrar un medicamento sustituto a menor costo o llenar un formulario para que el seguro cubra el medicamento que se considera necesario.   Si se requiere una autorizacin previa para que su compaa de seguros cubra su medicamento, por favor permtanos de 1 a 2 das hbiles para completar este proceso.  Los precios de los medicamentos varan con frecuencia dependiendo del lugar de dnde se surte la receta y alguna farmacias pueden ofrecer precios ms baratos.  El sitio web www.goodrx.com tiene cupones para medicamentos de diferentes farmacias. Los precios aqu no tienen en cuenta   lo que podra costar con la ayuda del seguro (puede ser ms barato con su seguro), pero el sitio web puede darle el precio si no utiliz ningn seguro.  - Puede imprimir el cupn correspondiente y llevarlo con su receta a la farmacia.  - Tambin puede pasar por nuestra oficina durante el horario de atencin regular y recoger una tarjeta de cupones de GoodRx.  - Si necesita que su receta se enve electrnicamente a una farmacia diferente, informe a nuestra oficina a travs de MyChart de [facility] o por telfono llamando al [phone] y presione la opcin 4.  

## 2022-10-13 NOTE — Progress Notes (Signed)
   Follow-Up Visit   Subjective  Marilyn Norris is a 42 y.o. female who presents for the following: Annual Exam (History of dysplastic nevi - The patient presents for Total-Body Skin Exam (TBSE) for skin cancer screening and mole check.  The patient has spots, moles and lesions to be evaluated, some may be new or changing and the patient has concerns that these could be cancer./).  Accompanied by daughters  The following portions of the chart were reviewed this encounter and updated as appropriate:   Tobacco  Allergies  Meds  Problems  Med Hx  Surg Hx  Fam Hx     Review of Systems:  No other skin or systemic complaints except as noted in HPI or Assessment and Plan.  Objective  Well appearing patient in no apparent distress; mood and affect are within normal limits.  A full examination was performed including scalp, head, eyes, ears, nose, lips, neck, chest, axillae, abdomen, back, buttocks, bilateral upper extremities, bilateral lower extremities, hands, feet, fingers, toes, fingernails, and toenails. All findings within normal limits unless otherwise noted below.  Face Rhytides and volume loss.    Assessment & Plan   History of Dysplastic Nevi - No evidence of recurrence today - Recommend regular full body skin exams - Recommend daily broad spectrum sunscreen SPF 30+ to sun-exposed areas, reapply every 2 hours as needed.  - Call if any new or changing lesions are noted between office visits  Lentigines - Scattered tan macules - Due to sun exposure - Benign-appearing, observe - Recommend daily broad spectrum sunscreen SPF 30+ to sun-exposed areas, reapply every 2 hours as needed. - Call for any changes  Seborrheic Keratoses - Stuck-on, waxy, tan-brown papules and/or plaques  - Benign-appearing - Discussed benign etiology and prognosis. - Observe - Call for any changes  Melanocytic Nevi - Tan-brown and/or pink-flesh-colored symmetric macules and papules -  Benign appearing on exam today - Observation - Call clinic for new or changing moles - Recommend daily use of broad spectrum spf 30+ sunscreen to sun-exposed areas.   Hemangiomas - Red papules - Discussed benign nature - Observe - Call for any changes  Actinic Damage - Chronic condition, secondary to cumulative UV/sun exposure - diffuse scaly erythematous macules with underlying dyspigmentation - Recommend daily broad spectrum sunscreen SPF 30+ to sun-exposed areas, reapply every 2 hours as needed.  - Staying in the shade or wearing long sleeves, sun glasses (UVA+UVB protection) and wide brim hats (4-inch brim around the entire circumference of the hat) are also recommended for sun protection.  - Call for new or changing lesions.  Skin cancer screening performed today.  Elastosis of skin Face Discussed Botox. Recommend 25 units to frown complex and 5 units to forehead - 30 units total ($390.00).  Folliculitis Chest Folliculitis possibly mixed with some inflamed sks and eczema Start doxycycline 20 mg 1 po bid with food and plenty of fluid Start Mometasone qd Start [facility] Sodium Sulfacetamide 9%, Sulfur 3% wash  doxycycline (PERIOSTAT) 20 MG tablet - Chest Take 1 tablet (20 mg total) by mouth 2 (two) times daily. mometasone (ELOCON) 0.1 % cream - Chest Apply 1 Application topically daily as needed (Rash).  Return in about 3 months (around 01/12/2023) for Follow up.  I, Ashok Cordia, CMA, am acting as scribe for Sarina Ser, MD . Documentation: I have reviewed the above documentation for accuracy and completeness, and I agree with the above.  Sarina Ser, MD

## 2022-10-14 ENCOUNTER — Telehealth: Payer: Self-pay

## 2022-10-14 ENCOUNTER — Telehealth: Payer: Self-pay | Admitting: Psychiatry

## 2022-10-14 DIAGNOSIS — F900 Attention-deficit hyperactivity disorder, predominantly inattentive type: Secondary | ICD-10-CM

## 2022-10-14 MED ORDER — METHYLPHENIDATE HCL ER (OSM) 54 MG PO TBCR: 54.0000 mg | EXTENDED_RELEASE_TABLET | ORAL | 0 refills | Status: DC

## 2022-10-14 MED ORDER — METHYLPHENIDATE HCL 5 MG PO TABS: 5.0000 mg | ORAL_TABLET | Freq: Every day | ORAL | 0 refills | Status: DC

## 2022-10-14 NOTE — Telephone Encounter (Signed)
Patient came in to check her blood pressure-within normal limits.  [facility] Concerta 54 mg, [facility] [address] [facility]

## 2022-10-14 NOTE — Telephone Encounter (Signed)
Patient came by office thinking appointment was today 10-14-22. States she had left message about med refill, but the note indicates she did not leave name of medication. She is out of Concerta 54 mg and 5 mg of [facility]italin [facility] [address]

## 2022-10-14 NOTE — Telephone Encounter (Signed)
Patient came in on the wrong day for appointment wanted vitals done  B/P-106/70 P-80 T- 98.7 Wt- 156.0 lb

## 2022-10-14 NOTE — Telephone Encounter (Signed)
left message that medication has been sent to the pharamcy

## 2022-10-14 NOTE — Telephone Encounter (Signed)
Noted  

## 2022-10-21 ENCOUNTER — Encounter: Payer: Self-pay | Admitting: Psychiatry

## 2022-10-21 ENCOUNTER — Encounter: Payer: Self-pay | Admitting: Dermatology

## 2022-10-21 ENCOUNTER — Telehealth (INDEPENDENT_AMBULATORY_CARE_PROVIDER_SITE_OTHER): Payer: BC Managed Care – PPO | Admitting: Psychiatry

## 2022-10-21 DIAGNOSIS — F900 Attention-deficit hyperactivity disorder, predominantly inattentive type: Secondary | ICD-10-CM

## 2022-10-21 DIAGNOSIS — Z8659 Personal history of other mental and behavioral disorders: Secondary | ICD-10-CM | POA: Diagnosis not present

## 2022-10-21 MED ORDER — METHYLPHENIDATE HCL ER (OSM) 54 MG PO TBCR: 54.0000 mg | EXTENDED_RELEASE_TABLET | ORAL | 0 refills | Status: DC

## 2022-10-21 MED ORDER — METHYLPHENIDATE HCL 5 MG PO TABS: 5.0000 mg | ORAL_TABLET | Freq: Every day | ORAL | 0 refills | Status: DC

## 2022-10-21 NOTE — Progress Notes (Signed)
Virtual Visit via Video Note  I connected with Marilyn Norris on 10/21/22 at  4:00 PM EST by a video enabled telemedicine application and verified that I am speaking with the correct person using two identifiers.  Location Provider Location : [facility] Patient Location : Work  Participants: Patient , Provider   I discussed the limitations of evaluation and management by telemedicine and the availability of in person appointments. The patient expressed understanding and agreed to proceed.   I discussed the assessment and treatment plan with the patient. The patient was provided an opportunity to ask questions and all were answered. The patient agreed with the plan and demonstrated an understanding of the instructions.   The patient was advised to call back or seek an in-person evaluation if the symptoms worsen or if the condition fails to improve as anticipated.    [place] MD OP Progress Note  10/22/2022 1:36 PM Marilyn Norris  MRN:  [id number]  Chief Complaint:  Chief Complaint  Patient presents with   Follow-up   ADD   HPI: Marilyn Norris is a 42 year old Caucasian female, employed, married, lives in [place], has a history of ADHD inattentive type, depression was evaluated by telemedicine today.  Patient today reports work continues to be stressful.  She however has been managing okay.  She reports the current combination of medications, Concerta 54 mg and Ritalin 5 mg as needed has been beneficial.  She does not take the Ritalin every day.  She reports the Concerta does take some time to kick in when she takes it in the morning.  She has to be mindful to take it at a certain time for it to be effective when she starts her work.  She is still working on it.  Patient reports she may have gained a few pounds since the past few months.  However that is likely also due to lack of exercise.  She reports she has been staying busy taking her kids for certain events even on weekends that  she does not get a lot of time to exercise or do any activities that would help her to lose weight.  Patient otherwise denies any significant depression or anxiety symptoms.  Reports sleep is overall okay.  Denies any side effects to her medications.  Denies any suicidality, homicidality or perceptual disturbances.  Patient denies any other concerns today.  Visit Diagnosis:    ICD-10-CM   1. Attention deficit hyperactivity disorder (ADHD), predominantly inattentive type  F90.0 methylphenidate (RITALIN) 5 MG tablet    methylphenidate (RITALIN) 5 MG tablet    methylphenidate (RITALIN) 5 MG tablet    methylphenidate (CONCERTA) 54 MG PO CR tablet    methylphenidate (CONCERTA) 54 MG PO CR tablet    methylphenidate (CONCERTA) 54 MG PO CR tablet    2. History of depression  Z86.59       Past Psychiatric History: Reviewed past psychiatric history from progress note on 06/26/2020.  Past trials of Adderall, Concerta, Ritalin.  Past Medical History:  Past Medical History:  Diagnosis Date   Adult ADHD    ADD   Dysplastic nevus 04/12/2018   R inf breast - mild   Dysplastic nevus 04/12/2018   L mid to upper back 4.0 cm lat to spine - severe, excision 11/14/2018   Dysplastic nevus 01/11/2019   R med sup pubic area - mild    Past Surgical History:  Procedure Laterality Date   CESAREAN SECTION  07/03/2009   HERNIA REPAIR  TONSILLECTOMY AND ADENOIDECTOMY     28-5 years old     Family Psychiatric History: Reviewed family psychiatric history from progress note on 06/26/2020.  Family History:  Family History  Problem Relation Age of Onset   Thyroid disease Mother    Cancer Father        Kidney    Thyroid disease Sister    Bipolar disorder Maternal Uncle    Breast cancer Neg Hx     Social History: Reviewed social history from progress note on 06/26/2020.  Past trials of Adderall, Concerta, Ritalin,Mydayis Social History   Socioeconomic History   Marital status: Married     Spouse name: Not on file   Number of children: Not on file   Years of education: Not on file   Highest education level: Not on file  Occupational History   Not on file  Tobacco Use   Smoking status: Never   Smokeless tobacco: Never  Vaping Use   Vaping Use: Never used  Substance and Sexual Activity   Alcohol use: Yes    Comment: SOCIAL   Drug use: No   Sexual activity: Yes    Birth control/protection: I.U.D.  Other Topics Concern   Not on file  Social History Narrative   Not on file   Social Determinants of Health   Financial Resource Strain: Not on file  Food Insecurity: Not on file  Transportation Needs: Not on file  Physical Activity: Not on file  Stress: Not on file  Social Connections: Not on file    Allergies:  Allergies  Allergen Reactions   Morphine Itching   Morphine And Related Itching    Metabolic Disorder Labs: No results found for: "HGBA1C", "MPG" No results found for: "PROLACTIN" No results found for: "CHOL", "TRIG", "HDL", "CHOLHDL", "VLDL", "LDLCALC" Lab Results  Component Value Date   TSH 1.070 09/12/2017    Therapeutic Level Labs: No results found for: "LITHIUM" No results found for: "VALPROATE" No results found for: "CBMZ"  Current Medications: Current Outpatient Medications  Medication Sig Dispense Refill   doxycycline (PERIOSTAT) 20 MG tablet Take 1 tablet (20 mg total) by mouth 2 (two) times daily. 60 tablet 3   hydrocortisone 2.5 % cream Apply topically.     [START ON 01/09/2023] methylphenidate (CONCERTA) 54 MG PO CR tablet Take 1 tablet (54 mg total) by mouth every morning. 30 tablet 0   [START ON 01/09/2023] methylphenidate (RITALIN) 5 MG tablet Take 1 tablet (5 mg total) by mouth daily after lunch. 30 tablet 0   mometasone (ELOCON) 0.1 % cream Apply 1 Application topically daily as needed (Rash). 45 g 3   Levonorgestrel (KYLEENA) 19.5 MG IUD 1 Device by Intrauterine route once for 1 dose. 1 Intra Uterine Device 0   [START ON  11/12/2022] methylphenidate (CONCERTA) 54 MG PO CR tablet Take 1 tablet (54 mg total) by mouth every morning. 30 tablet 0   [START ON 12/11/2022] methylphenidate (CONCERTA) 54 MG PO CR tablet Take 1 tablet (54 mg total) by mouth every morning. 30 tablet 0   [START ON 11/12/2022] methylphenidate (RITALIN) 5 MG tablet Take 1 tablet (5 mg total) by mouth daily after lunch. 30 tablet 0   [START ON 12/11/2022] methylphenidate (RITALIN) 5 MG tablet Take 1 tablet (5 mg total) by mouth daily after lunch. 30 tablet 0   No current facility-administered medications for this visit.     Musculoskeletal: Strength & Muscle Tone:  UTA Gait & Station:  Seated Patient leans: N/A  Psychiatric Specialty Exam: Review of Systems  Psychiatric/Behavioral:  Positive for decreased concentration (improving).   All other systems reviewed and are negative.   There were no vitals taken for this visit.There is no height or weight on file to calculate BMI.  Vitals-reviewed from date 10/14/2022 when patient came into the office-BP-106/70, pulse rate-80, temperature-98.7, weight-156 pounds.  General Appearance: Casual  Eye Contact:  Fair  Speech:  Clear and Coherent  Volume:  Normal  Mood:  Euthymic  Affect:  Congruent  Thought Process:  Goal Directed and Descriptions of Associations: Intact  Orientation:  Full (Time, Place, and Person)  Thought Content: Logical   Suicidal Thoughts:  No  Homicidal Thoughts:  No  Memory:  Immediate;   Fair Recent;   Fair Remote;   Fair  Judgement:  Fair  Insight:  Fair  Psychomotor Activity:  Normal  Concentration:  Concentration: Fair and Attention Span: Fair  Recall:  [employer] of Knowledge: Fair  Language: Fair  Akathisia:  No  Handed:  Right  AIMS (if indicated): not done  Assets:  Communication Skills Desire for Improvement Housing Social Support  ADL's:  Intact  Cognition: WNL  Sleep:  Fair   Screenings: PHQ2-9    Flowsheet Row Video Visit from 10/21/2022 in [place] Video Visit from 05/13/2022 in [place] Office Visit from 02/17/2022 in [place] Video Visit from 01/26/2022 in [facility] Office Visit from 10/14/2021 in [facility]  PHQ-2 Total Score 0 0 0 0 0  PHQ-9 Total Score -- -- 1 -- --      Flowsheet Row Video Visit from 10/21/2022 in [place] Video Visit from 05/13/2022 in [place] Office Visit from 02/17/2022 in [facility] No Risk No Risk No Risk        Assessment and Plan: Mylee Falin is a 42 year old Caucasian female, employed, married, has a history of ADHD was evaluated by telemedicine today.  Patient is currently stable.  Plan as noted below.  Plan ADHD inattentive type-stable Concerta 54 mg p.o. daily in the morning Ritalin 5 mg p.o. daily in the afternoon Provided 3 prescriptions with date specified. Reviewed [place] PMP AWARxE  History of depression-Will monitor closely  Discussed lifestyle modification, exercise.  Follow-up in clinic in 3 months or sooner if needed.  Consent: Patient/Guardian gives verbal consent for treatment and assignment of benefits for services provided during this visit. Patient/Guardian expressed understanding and agreed to proceed.   This note was generated in part or whole with voice recognition software. Voice recognition is usually quite accurate but there are transcription errors that can and very often do occur. I apologize for any typographical errors that were not detected and corrected.     Ursula Alert, MD 10/22/2022, 1:36 PM

## 2022-11-09 ENCOUNTER — Other Ambulatory Visit: Payer: Self-pay

## 2022-11-09 DIAGNOSIS — L739 Follicular disorder, unspecified: Secondary | ICD-10-CM

## 2022-11-09 MED ORDER — DOXYCYCLINE HYCLATE 20 MG PO TABS: 20.0000 mg | ORAL_TABLET | Freq: Two times a day (BID) | ORAL | 0 refills | Status: DC

## 2022-11-09 NOTE — Progress Notes (Signed)
Pharmacy requesting 3 month supply. Escripted

## 2023-01-14 ENCOUNTER — Other Ambulatory Visit: Payer: Self-pay | Admitting: Dermatology

## 2023-01-14 DIAGNOSIS — L739 Follicular disorder, unspecified: Secondary | ICD-10-CM

## 2023-01-17 ENCOUNTER — Telehealth: Payer: Self-pay

## 2023-01-17 NOTE — Telephone Encounter (Signed)
pt called states she needs a refill on the methylphenidate 54mg . pt was last ssen on 2-1 next appt 5-15    Disp Refills Start End   methylphenidate (CONCERTA) 54 MG PO CR tablet 30 tablet 0 01/09/2023    Sig - Route: Take 1 tablet (54 mg total) by mouth every morning. - Oral   Sent to pharmacy as: methylphenidate (CONCERTA) 54 MG CR tablet   Earliest Fill Date: 01/09/2023   Notes to Pharmacy: Please fill on or after 01/09/2023   E-Prescribing Status: Receipt confirmed by pharmacy (10/21/2022  4:27 PM EST)

## 2023-01-17 NOTE — Telephone Encounter (Signed)
called pharmacy and they had rx on hold so they will get ready for patient.      Disp Refills Start End   methylphenidate (CONCERTA) 54 MG PO CR tablet 30 tablet 0 01/09/2023    Sig - Route: Take 1 tablet (54 mg total) by mouth every morning. - Oral   Sent to pharmacy as: methylphenidate (CONCERTA) 54 MG CR tablet   Earliest Fill Date: 01/09/2023   Notes to Pharmacy: Please fill on or after 01/09/2023   E-Prescribing Status: Receipt confirmed by pharmacy (10/21/2022  4:27 PM EST)

## 2023-01-17 NOTE — Telephone Encounter (Signed)
left message that rx was on hold at the pharmacy and that they will go ahead and process rx.

## 2023-01-25 ENCOUNTER — Ambulatory Visit: Payer: BC Managed Care – PPO | Admitting: Dermatology

## 2023-01-25 VITALS — BP 121/69 | HR 91

## 2023-01-25 DIAGNOSIS — L739 Follicular disorder, unspecified: Secondary | ICD-10-CM

## 2023-01-25 DIAGNOSIS — L988 Other specified disorders of the skin and subcutaneous tissue: Secondary | ICD-10-CM | POA: Diagnosis not present

## 2023-01-25 DIAGNOSIS — Z7189 Other specified counseling: Secondary | ICD-10-CM

## 2023-01-25 DIAGNOSIS — Z79899 Other long term (current) drug therapy: Secondary | ICD-10-CM

## 2023-01-25 NOTE — Progress Notes (Signed)
   Follow-Up Visit   Subjective  Marilyn Norris is a 42 y.o. female who presents for the following: 3 months f/u on folliculitis on her chest treating with Doxycycline 20 mg qd-bid prn, Skin medicinals sulfa wash and Mometasone cream prn with a good response.  The following portions of the chart were reviewed this encounter and updated as appropriate: medications, allergies, medical history  Review of Systems:  No other skin or systemic complaints except as noted in HPI or Assessment and Plan.  Objective  Well appearing patient in no apparent distress; mood and affect are within normal limits. A focused examination was performed of the following areas: Relevant exam findings are noted in the Assessment and Plan.   Assessment & Plan   Folliculitis Chest Folliculitis- improved  Continue doxycycline 20 mg 1 tablet daily in the evening with food and plenty of fluid Continue  Skin Medicinals Sodium Sulfacetamide 9%, Sulfur 3% wash D/C Mometasone cream    Elastosis of skin Face Discussed Botox. Recommend 25 units to frown complex and 5 units to forehead - 30 units total ($390.00).   Return in about 6 months (around 07/28/2023) for Folliculitis .  IAngelique Holm, CMA, am acting as scribe for Armida Sans, MD .   Documentation: I have reviewed the above documentation for accuracy and completeness, and I agree with the above.  Armida Sans, MD

## 2023-01-25 NOTE — Patient Instructions (Signed)
Due to recent changes in healthcare laws, you may see results of your pathology and/or laboratory studies on MyChart before the doctors have had a chance to review them. We understand that in some cases there may be results that are confusing or concerning to you. Please understand that not all results are received at the same time and often the doctors may need to interpret multiple results in order to provide you with the best plan of care or course of treatment. Therefore, we ask that you please give us 2 business days to thoroughly review all your results before contacting the office for clarification. Should we see a critical lab result, you will be contacted sooner.   If You Need Anything After Your Visit  If you have any questions or concerns for your doctor, please call our main line at [phone] and press option 4 to reach your doctor's medical assistant. If no one answers, please leave a voicemail as directed and we will return your call as soon as possible. Messages left after 4 pm will be answered the following business day.   You may also send us a message via MyChart. We typically respond to MyChart messages within 1-2 business days.  For prescription refills, please ask your pharmacy to contact our office. Our fax number is [phone].  If you have an urgent issue when the clinic is closed that cannot wait until the next business day, you can page your doctor at the number below.    Please note that while we do our best to be available for urgent issues outside of office hours, we are not available 24/7.   If you have an urgent issue and are unable to reach us, you may choose to seek medical care at your doctor's office, retail clinic, urgent care center, or emergency room.  If you have a medical emergency, please immediately call 911 or go to the emergency department.  Pager Numbers  - Dr. Kowalski: [phone]  - Dr. Moye: [phone]  - Dr. Stewart:  [phone]  In the event of inclement weather, please call our main line at [phone] for an update on the status of any delays or closures.  Dermatology Medication Tips: Please keep the boxes that topical medications come in in order to help keep track of the instructions about where and how to use these. Pharmacies typically print the medication instructions only on the boxes and not directly on the medication tubes.   If your medication is too expensive, please contact our office at [phone] option 4 or send us a message through MyChart.   We are unable to tell what your co-pay for medications will be in advance as this is different depending on your insurance coverage. However, we may be able to find a substitute medication at lower cost or fill out paperwork to get insurance to cover a needed medication.   If a prior authorization is required to get your medication covered by your insurance company, please allow us 1-2 business days to complete this process.  Drug prices often vary depending on where the prescription is filled and some pharmacies may offer cheaper prices.  The website www.goodrx.com contains coupons for medications through different pharmacies. The prices here do not account for what the cost may be with help from insurance (it may be cheaper with your insurance), but the website can give you the price if you did not use any insurance.  - You can print the associated coupon and take it with   your prescription to the pharmacy.  - You may also stop by our office during regular business hours and pick up a GoodRx coupon card.  - If you need your prescription sent electronically to a different pharmacy, notify our office through [facility] MyChart or by phone at [phone] option 4.     Si Usted Necesita Algo Despus de Su Visita  Tambin puede enviarnos un mensaje a travs de MyChart. Por lo general respondemos a los mensajes de MyChart en el transcurso de 1 a 2  das hbiles.  Para renovar recetas, por favor pida a su farmacia que se ponga en contacto con nuestra oficina. Nuestro nmero de fax es el [phone].  Si tiene un asunto urgente cuando la clnica est cerrada y que no puede esperar hasta el siguiente da hbil, puede llamar/localizar a su doctor(a) al nmero que aparece a continuacin.   Por favor, tenga en cuenta que aunque hacemos todo lo posible para estar disponibles para asuntos urgentes fuera del horario de oficina, no estamos disponibles las 24 horas del da, los 7 das de la semana.   Si tiene un problema urgente y no puede comunicarse con nosotros, puede optar por buscar atencin mdica  en el consultorio de su doctor(a), en una clnica privada, en un centro de atencin urgente o en una sala de emergencias.  Si tiene una emergencia mdica, por favor llame inmediatamente al 911 o vaya a la sala de emergencias.  Nmeros de bper  - Dr. Kowalski: [phone]  - Dra. Moye: [phone]  - Dra. Stewart: [phone]  En caso de inclemencias del tiempo, por favor llame a nuestra lnea principal al [phone] para una actualizacin sobre el estado de cualquier retraso o cierre.  Consejos para la medicacin en dermatologa: Por favor, guarde las cajas en las que vienen los medicamentos de uso tpico para ayudarle a seguir las instrucciones sobre dnde y cmo usarlos. Las farmacias generalmente imprimen las instrucciones del medicamento slo en las cajas y no directamente en los tubos del medicamento.   Si su medicamento es muy caro, por favor, pngase en contacto con nuestra oficina llamando al [phone] y presione la opcin 4 o envenos un mensaje a travs de MyChart.   No podemos decirle cul ser su copago por los medicamentos por adelantado ya que esto es diferente dependiendo de la cobertura de su seguro. Sin embargo, es posible que podamos encontrar un medicamento sustituto a menor costo o llenar un formulario para que el  seguro cubra el medicamento que se considera necesario.   Si se requiere una autorizacin previa para que su compaa de seguros cubra su medicamento, por favor permtanos de 1 a 2 das hbiles para completar este proceso.  Los precios de los medicamentos varan con frecuencia dependiendo del lugar de dnde se surte la receta y alguna farmacias pueden ofrecer precios ms baratos.  El sitio web www.goodrx.com tiene cupones para medicamentos de diferentes farmacias. Los precios aqu no tienen en cuenta lo que podra costar con la ayuda del seguro (puede ser ms barato con su seguro), pero el sitio web puede darle el precio si no utiliz ningn seguro.  - Puede imprimir el cupn correspondiente y llevarlo con su receta a la farmacia.  - Tambin puede pasar por nuestra oficina durante el horario de atencin regular y recoger una tarjeta de cupones de GoodRx.  - Si necesita que su receta se enve electrnicamente a una farmacia diferente, informe a nuestra oficina a travs de MyChart de [facility]   o por telfono llamando al [phone] y presione la opcin 4.  

## 2023-01-27 ENCOUNTER — Encounter: Payer: Self-pay | Admitting: Dermatology

## 2023-02-01 ENCOUNTER — Telehealth: Payer: Self-pay

## 2023-02-01 NOTE — Telephone Encounter (Signed)
Left message for patient to call office back to schedule annual appt 

## 2023-02-02 ENCOUNTER — Encounter: Payer: Self-pay | Admitting: Psychiatry

## 2023-02-02 ENCOUNTER — Telehealth: Payer: Self-pay

## 2023-02-02 ENCOUNTER — Telehealth (INDEPENDENT_AMBULATORY_CARE_PROVIDER_SITE_OTHER): Payer: BC Managed Care – PPO | Admitting: Psychiatry

## 2023-02-02 DIAGNOSIS — F900 Attention-deficit hyperactivity disorder, predominantly inattentive type: Secondary | ICD-10-CM

## 2023-02-02 DIAGNOSIS — Z8659 Personal history of other mental and behavioral disorders: Secondary | ICD-10-CM | POA: Diagnosis not present

## 2023-02-02 MED ORDER — METHYLPHENIDATE HCL ER (OSM) 54 MG PO TBCR
54.0000 mg | EXTENDED_RELEASE_TABLET | ORAL | 0 refills | Status: DC
Start: 2023-02-14 — End: 2023-02-22

## 2023-02-02 MED ORDER — METHYLPHENIDATE HCL ER (OSM) 18 MG PO TBCR
18.0000 mg | EXTENDED_RELEASE_TABLET | ORAL | 0 refills | Status: DC
Start: 2023-02-02 — End: 2023-02-22

## 2023-02-02 NOTE — Progress Notes (Unsigned)
Virtual Visit via Video Note  I connected with [name] on 02/02/23 at  3:40 PM EDT by a video enabled telemedicine application and verified that I am speaking with the correct person using two identifiers.  Location Provider Location : [facility] Patient Location : Work  Participants: Patient , Provider    I discussed the limitations of evaluation and management by telemedicine and the availability of in person appointments. The patient expressed understanding and agreed to proceed.    I discussed the assessment and treatment plan with the patient. The patient was provided an opportunity to ask questions and all were answered. The patient agreed with the plan and demonstrated an understanding of the instructions.   The patient was advised to call back or seek an in-person evaluation if the symptoms worsen or if the condition fails to improve as anticipated.   BH MD OP Progress Note  02/03/2023 9:19 AM Kemonie Duca  MRN:  [id number]  Chief Complaint:  Chief Complaint  Patient presents with   Follow-up   ADD   Medication Refill   HPI: Marilyn Norris is a 42 year old Caucasian female, employed, married, lives in [place], has a history of ADHD inattentive type, depression was evaluated by telemedicine today.  Patient today reports she continues to struggle with concentration problems.  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  She also reports since it is the' spring time'  her children also has a lot of activities that she has to keep up with.  She reports Concerta 54 mg during the day and Ritalin 5 mg at the end of the day as needed was initially helpful.  She however has been taking the Ritalin a bit earlier recently and she feels it as not lasting till the end of the workday.  She wonders if her medication dosages can be readjusted to help her better.  She is not interested in changing her medication to another stimulant since  she has had side effects in the past and is also worried about the cost.  Patient otherwise denies any significant depression or anxiety symptoms.  Reports she has been trying to make some changes in her life style so she can have more time for herself.  Patient denies any suicidality, homicidality or perceptual disturbances.  Patient denies any other concerns today.  Visit Diagnosis:    ICD-10-CM   1. Attention deficit hyperactivity disorder (ADHD), predominantly inattentive type  F90.0 methylphenidate 54 MG PO CR tablet    methylphenidate (CONCERTA) 18 MG PO CR tablet    2. History of depression  Z86.59       Past Psychiatric History: I have reviewed past psychiatric history from progress note on 06/26/2020.  Past trials of Adderall, Concerta, Ritalin.  Past Medical History:  Past Medical History:  Diagnosis Date   Adult ADHD    ADD   Dysplastic nevus 04/12/2018   R inf breast - mild   Dysplastic nevus 04/12/2018   L mid to upper back 4.0 cm lat to spine - severe, excision 11/14/2018   Dysplastic nevus 01/11/2019   R med sup pubic area - mild    Past Surgical History:  Procedure Laterality Date   CESAREAN SECTION  07/03/2009   HERNIA REPAIR     TONSILLECTOMY AND ADENOIDECTOMY     20-35 years old     Family Psychiatric History: I have reviewed family psychiatric history from progress note on 06/26/2020.  Family History:  Family History  Problem Relation Age of Onset   Thyroid disease Mother    Cancer Father        Kidney    Thyroid disease Sister    Bipolar disorder Maternal Uncle    Breast cancer Neg Hx     Social History: I have reviewed social history from progress note on 06/26/2020. Social History   Socioeconomic History   Marital status: Married    Spouse name: Not on file   Number of children: Not on file   Years of education: Not on file   Highest education level: Not on file  Occupational History   Not on file  Tobacco Use   Smoking status:  Never   Smokeless tobacco: Never  Vaping Use   Vaping Use: Never used  Substance and Sexual Activity   Alcohol use: Yes    Comment: SOCIAL   Drug use: No   Sexual activity: Yes    Birth control/protection: I.U.D.  Other Topics Concern   Not on file  Social History Narrative   Not on file   Social Determinants of Health   Financial Resource Strain: Not on file  Food Insecurity: Not on file  Transportation Needs: Not on file  Physical Activity: Not on file  Stress: Not on file  Social Connections: Not on file    Allergies:  Allergies  Allergen Reactions   Morphine Itching   Morphine And Codeine Itching    Metabolic Disorder Labs: No results found for: "HGBA1C", "MPG" No results found for: "PROLACTIN" No results found for: "CHOL", "TRIG", "HDL", "CHOLHDL", "VLDL", "LDLCALC" Lab Results  Component Value Date   TSH 1.070 09/12/2017    Therapeutic Level Labs: No results found for: "LITHIUM" No results found for: "VALPROATE" No results found for: "CBMZ"  Current Medications: Current Outpatient Medications  Medication Sig Dispense Refill   methylphenidate (CONCERTA) 18 MG PO CR tablet Take 1 tablet (18 mg total) by mouth every morning. Take along with 54 mg daily 30 tablet 0   [START ON 02/14/2023] methylphenidate 54 MG PO CR tablet Take 1 tablet (54 mg total) by mouth every morning. Take along with 18 mg daily 30 tablet 0   doxycycline (PERIOSTAT) 20 MG tablet TAKE 1 TABLET BY MOUTH TWICE A DAY 180 tablet 0   hydrocortisone 2.5 % cream Apply topically.     Levonorgestrel (KYLEENA) 19.5 MG IUD 1 Device by Intrauterine route once for 1 dose. 1 Intra Uterine Device 0   methylphenidate (RITALIN) 5 MG tablet Take 1 tablet (5 mg total) by mouth daily after lunch. 30 tablet 0   methylphenidate (RITALIN) 5 MG tablet Take 1 tablet (5 mg total) by mouth daily after lunch. 30 tablet 0   methylphenidate (RITALIN) 5 MG tablet Take 1 tablet (5 mg total) by mouth daily after lunch.  30 tablet 0   mometasone (ELOCON) 0.1 % cream Apply 1 Application topically daily as needed (Rash). 45 g 3   No current facility-administered medications for this visit.     Musculoskeletal: Strength & Muscle Tone:  UTA Gait & Station:  Seated Patient leans: N/A  Psychiatric Specialty Exam: Review of Systems  Psychiatric/Behavioral:  Positive for decreased concentration.   All other systems reviewed and are negative.   There were no vitals taken for this visit.There is no height or weight on file to calculate BMI.  General Appearance: Casual  Eye Contact:  Fair  Speech:  Clear and Coherent  Volume:  Normal  Mood:  Euthymic  Affect:  Congruent  Thought Process:  Goal Directed and Descriptions of Associations: Intact  Orientation:  Full (Time, Place, and Person)  Thought Content: Logical   Suicidal Thoughts:  No  Homicidal Thoughts:  No  Memory:  Immediate;   Fair Recent;   Fair Remote;   Fair  Judgement:  Fair  Insight:  Fair  Psychomotor Activity:  Normal  Concentration:  Concentration: Fair and Attention Span: Fair  Recall:  Good  Fund of Knowledge: Fair  Language: Fair  Akathisia:  No  Handed:  Right  AIMS (if indicated): not done  Assets:  Communication Skills Desire for Improvement Housing Intimacy Social Support Talents/Skills Transportation  ADL's:  Intact  Cognition: WNL  Sleep:  Fair   Screenings: PHQ2-9    Flowsheet Row Video Visit from 10/21/2022 in [facility] Video Visit from 05/13/2022 in [facility] Office Visit from 02/17/2022 in [facility] Video Visit from 01/26/2022 in [facility] Office Visit from 10/14/2021 in [facility]  PHQ-2 Total Score 0 0 0 0 0  PHQ-9 Total Score -- -- 1 -- --      Flowsheet Row Video Visit from 10/21/2022 in [facility] Video Visit from 05/13/2022 in [facility] Office Visit from 02/17/2022 in [facility]  C-SSRS RISK CATEGORY No Risk No Risk No Risk        Assessment and Plan: Lamanda Wahlman is a 42 year old Caucasian female, employed, married, history of ADHD was evaluated by telemedicine today.  Patient is currently struggling with attention, concentration problem, medication effect wearing off sooner, will benefit from the following plan.  Plan ADHD inattentive type-unstable Increase Concerta to 72 mg p.o. daily in the morning Ritalin 5 mg p.o. daily in the afternoon Patient has 2 prescriptions for Ritalin pending at the pharmacy. I have sent an extra dose of Concerta 18 mg to the pharmacy and Concerta 54 mg with date specified. Reviewed [place] PMP AWARxE  Follow-up in clinic in 1 month or sooner if needed.  Consent: Patient/Guardian gives verbal consent for treatment and assignment of benefits for services provided during this visit. Patient/Guardian expressed understanding and agreed to proceed.   This note was generated in part or whole with voice recognition software. Voice recognition is usually quite accurate but there are transcription errors that can and very often do occur. I apologize for any typographical errors that were not detected and corrected.    Jomarie Longs, MD 02/03/2023, 9:19 AM

## 2023-02-02 NOTE — Telephone Encounter (Signed)
went online to [email or web address] and it was a request for a prior auth for the methylphenidate 18mg  - prior auth was submitted and is pending.

## 2023-02-07 NOTE — Telephone Encounter (Signed)
recieved fax that methylphenidate er 18mg  was approved from 02-04-23 to 02-03-26

## 2023-02-22 ENCOUNTER — Other Ambulatory Visit: Payer: Self-pay | Admitting: Psychiatry

## 2023-02-22 DIAGNOSIS — F900 Attention-deficit hyperactivity disorder, predominantly inattentive type: Secondary | ICD-10-CM

## 2023-02-22 MED ORDER — METHYLPHENIDATE HCL ER (OSM) 54 MG PO TBCR
54.0000 mg | EXTENDED_RELEASE_TABLET | ORAL | 0 refills | Status: DC
Start: 2023-03-17 — End: 2023-04-18

## 2023-02-22 MED ORDER — METHYLPHENIDATE HCL 5 MG PO TABS: 5.0000 mg | ORAL_TABLET | Freq: Every day | ORAL | 0 refills | Status: DC

## 2023-02-22 MED ORDER — METHYLPHENIDATE HCL ER (OSM) 18 MG PO TBCR: 18.0000 mg | EXTENDED_RELEASE_TABLET | ORAL | 0 refills | Status: DC

## 2023-02-22 NOTE — Telephone Encounter (Signed)
From: [name] To: Office of Jomarie Longs, MD Sent: 02/21/2023 12:31 PM EDT Subject: Medication Renewal Request  Refills have been requested for the following medications:   methylphenidate (RITALIN) 5 MG tablet [Tiaunna Buford]  Patient Comment: I would like to continue this medication along with the 54mg  and 18mg .   methylphenidate 54 MG PO CR tablet [Zedrick Springsteen]  Patient Comment: I would like to continue this medication along with the 18mg  and 5mg .   methylphenidate (CONCERTA) 18 MG PO CR tablet [Labrenda Lasky]  Patient Comment: I would like to continue this medication along with the 54mg  and 5mg .  Preferred [facility]: [id number] [place], [place] - [address] Delivery method: Daryll Drown

## 2023-02-22 NOTE — Telephone Encounter (Signed)
I have sent prescriptions for Ritalin 5 mg, Concerta 54 mg, Concerta [facility] [place], all with date specified.

## 2023-03-30 ENCOUNTER — Telehealth (INDEPENDENT_AMBULATORY_CARE_PROVIDER_SITE_OTHER): Payer: BC Managed Care – PPO | Admitting: Psychiatry

## 2023-03-30 ENCOUNTER — Encounter: Payer: Self-pay | Admitting: Psychiatry

## 2023-03-30 DIAGNOSIS — Z8659 Personal history of other mental and behavioral disorders: Secondary | ICD-10-CM | POA: Diagnosis not present

## 2023-03-30 DIAGNOSIS — F900 Attention-deficit hyperactivity disorder, predominantly inattentive type: Secondary | ICD-10-CM

## 2023-03-30 MED ORDER — METHYLPHENIDATE HCL ER (OSM) 18 MG PO TBCR: 18.0000 mg | EXTENDED_RELEASE_TABLET | ORAL | 0 refills | Status: DC

## 2023-03-30 NOTE — Progress Notes (Signed)
Virtual Visit via Video Note  I connected with Marilyn Norris on 03/30/23 at  3:00 PM EDT by a video enabled telemedicine application and verified that I am speaking with the correct person using two identifiers.  Location Provider Location : Remote Office  Patient Location : Car  Participants: Patient , Provider    I discussed the limitations of evaluation and management by telemedicine and the availability of in person appointments. The patient expressed understanding and agreed to proceed.   I discussed the assessment and treatment plan with the patient. The patient was provided an opportunity to ask questions and all were answered. The patient agreed with the plan and demonstrated an understanding of the instructions.   The patient was advised to call back or seek an in-person evaluation if the symptoms worsen or if the condition fails to improve as anticipated.    BH MD OP Progress Note  03/30/2023 3:18 PM Marilyn Norris  MRN:  [id number]  Chief Complaint:  Chief Complaint  Patient presents with   Follow-up   Anxiety   ADHD   Medication Refill   HPI: Marilyn Norris is a 42 year old Caucasian female, employed, married, lives in [place], has a history of ADHD inattentive type, depression was evaluated by telemedicine today.  Patient today reports she is currently enjoying her summer break.  She is trying to be proactive and has joined a gym, has a fitness pass as well as was able to get a treadmill at home so she can work out at home the days that she is not able to make it to the gym.  Patient reports she has been spending a lot of time traveling the past weeks.  She also has been spending a lot of time with her children and making use of the pool in the community.  Patient reports overall she is tolerating the Concerta higher dosage and it seems to keep her motivated.  She reports the higher dosage is beneficial for her concentration and focus.  She takes the 5  mg as needed dosage couple of times a week only.  Since she is currently on her summer break she does not need it daily.  Patient reports sleep is overall good.  Reports appetite is fair.  Patient reports she has been monitoring her blood pressure and heart rate.  Recently had atypical chest pain and neck pain and was evaluated by her primary care provider.  Cardiac causes were ruled out.  I have reviewed notes per primary care provider Dr. Barrett Shell 12/20/2022-EKG showed normal sinus rhythm with no significant ST-T wave changes.'  Patient denies any suicidality, homicidality or perceptual disturbances.  Denies any current use of any illicit substances or alcohol.  Patient denies any other concerns today.  Visit Diagnosis:    ICD-10-CM   1. Attention deficit hyperactivity disorder (ADHD), predominantly inattentive type  F90.0 methylphenidate (CONCERTA) 18 MG PO CR tablet    2. History of depression  Z86.59       Past Psychiatric History: I have reviewed past psychiatric history from progress note on 06/26/2020.  Past trials of Adderall, Concerta, Ritalin.  Past Medical History:  Past Medical History:  Diagnosis Date   Adult ADHD    ADD   Dysplastic nevus 04/12/2018   R inf breast - mild   Dysplastic nevus 04/12/2018   L mid to upper back 4.0 cm lat to spine - severe, excision 11/14/2018   Dysplastic nevus 01/11/2019   R med sup pubic area -  mild    Past Surgical History:  Procedure Laterality Date   CESAREAN SECTION  07/03/2009   HERNIA REPAIR     TONSILLECTOMY AND ADENOIDECTOMY     63-74 years old     Family Psychiatric History: I have reviewed family psychiatric history from progress note on 06/26/2020.  Family History:  Family History  Problem Relation Age of Onset   Thyroid disease Mother    Cancer Father        Kidney    Thyroid disease Sister    Bipolar disorder Maternal Uncle    Breast cancer Neg Hx     Social History: I have reviewed social history  from progress note on 06/26/2020. Social History   Socioeconomic History   Marital status: Married    Spouse name: Not on file   Number of children: Not on file   Years of education: Not on file   Highest education level: Not on file  Occupational History   Not on file  Tobacco Use   Smoking status: Never   Smokeless tobacco: Never  Vaping Use   Vaping Use: Never used  Substance and Sexual Activity   Alcohol use: Yes    Comment: SOCIAL   Drug use: No   Sexual activity: Yes    Birth control/protection: I.U.D.  Other Topics Concern   Not on file  Social History Narrative   Not on file   Social Determinants of Health   Financial Resource Strain: Not on file  Food Insecurity: Not on file  Transportation Needs: Not on file  Physical Activity: Not on file  Stress: Not on file  Social Connections: Not on file    Allergies:  Allergies  Allergen Reactions   Morphine Itching   Morphine And Codeine Itching    Metabolic Disorder Labs: No results found for: "HGBA1C", "MPG" No results found for: "PROLACTIN" No results found for: "CHOL", "TRIG", "HDL", "CHOLHDL", "VLDL", "LDLCALC" Lab Results  Component Value Date   TSH 1.070 09/12/2017    Therapeutic Level Labs: No results found for: "LITHIUM" No results found for: "VALPROATE" No results found for: "CBMZ"  Current Medications: Current Outpatient Medications  Medication Sig Dispense Refill   doxycycline (PERIOSTAT) 20 MG tablet TAKE 1 TABLET BY MOUTH TWICE A DAY 180 tablet 0   hydrocortisone 2.5 % cream Apply topically.     Levonorgestrel (KYLEENA) 19.5 MG IUD 1 Device by Intrauterine route once for 1 dose. 1 Intra Uterine Device 0   methylphenidate (CONCERTA) 18 MG PO CR tablet Take 1 tablet (18 mg total) by mouth every morning. Take along with 54 mg daily 90 tablet 0   methylphenidate (RITALIN) 5 MG tablet Take 1 tablet (5 mg total) by mouth daily after lunch. 30 tablet 0   methylphenidate (RITALIN) 5 MG tablet  Take 1 tablet (5 mg total) by mouth daily after lunch. 30 tablet 0   methylphenidate (RITALIN) 5 MG tablet Take 1 tablet (5 mg total) by mouth daily after lunch. 30 tablet 0   methylphenidate 54 MG PO CR tablet Take 1 tablet (54 mg total) by mouth every morning. Take along with 18 mg daily 30 tablet 0   mometasone (ELOCON) 0.1 % cream Apply 1 Application topically daily as needed (Rash). 45 g 3   No current facility-administered medications for this visit.     Musculoskeletal: Strength & Muscle Tone:  UTA Gait & Station:  Seated Patient leans: N/A  Psychiatric Specialty Exam: Review of Systems  Psychiatric/Behavioral: Negative.  All other systems reviewed and are negative.   There were no vitals taken for this visit.There is no height or weight on file to calculate BMI.  General Appearance: Fairly Groomed  Eye Contact:  Fair  Speech:  Clear and Coherent  Volume:  Normal  Mood:  Euthymic  Affect:  Congruent  Thought Process:  Goal Directed and Descriptions of Associations: Intact  Orientation:  Full (Time, Place, and Person)  Thought Content: Logical   Suicidal Thoughts:  No  Homicidal Thoughts:  No  Memory:  Immediate;   Fair Recent;   Fair Remote;   Fair  Judgement:  Fair  Insight:  Fair  Psychomotor Activity:  Normal  Concentration:  Concentration: Fair and Attention Span: Fair  Recall:  [employer] of Knowledge: Fair  Language: Fair  Akathisia:  No  Handed:  Right  AIMS (if indicated): not done  Assets:  Communication Skills Desire for Improvement Housing Intimacy Social Support Talents/Skills Transportation  ADL's:  Intact  Cognition: WNL  Sleep:  Fair   Screenings: PHQ2-9    Flowsheet Row Video Visit from 10/21/2022 in [facility] Video Visit from 05/13/2022 in [facility] Office Visit from 02/17/2022 in [facility] Video Visit from  01/26/2022 in [facility] Office Visit from 10/14/2021 in [facility]  PHQ-2 Total Score 0 0 0 0 0  PHQ-9 Total Score -- -- 1 -- --      Flowsheet Row Video Visit from 03/30/2023 in [facility] Video Visit from 10/21/2022 in [facility] Video Visit from 05/13/2022 in [facility]  C-SSRS RISK CATEGORY No Risk No Risk No Risk        Assessment and Plan: Marilyn Norris is a 42 year old Caucasian female, employed, married, has a history of ADHD was evaluated by telemedicine today.  Patient is currently improving on the current medication regimen.  Plan as noted below.  Plan ADHD inattentive type-improving Concerta 72 mg p.o. daily in the morning Ritalin 5 mg p.o. daily in the afternoon Patient provided 90 days supply of Concerta 18 mg.  She will check with pharmacy and let this writer know if it is approved.  Concerta 54 mg 90 days supply could be sent in after getting an update from the patient. Reviewed [place] PMP AWARxE I have reviewed notes per primary care provider-Dr. Burnadette Pop as well as EKG-normal sinus rhythm as noted above.  Follow-up in clinic in 2 months or sooner in person.    Consent: Patient/Guardian gives verbal consent for treatment and assignment of benefits for services provided during this visit. Patient/Guardian expressed understanding and agreed to proceed.   This note was generated in part or whole with voice recognition software. Voice recognition is usually quite accurate but there are transcription errors that can and very often do occur. I apologize for any typographical errors that were not detected and corrected.      Jomarie Longs, MD 03/30/2023, 3:18 PM

## 2023-04-15 ENCOUNTER — Other Ambulatory Visit: Payer: Self-pay | Admitting: Dermatology

## 2023-04-15 DIAGNOSIS — L739 Follicular disorder, unspecified: Secondary | ICD-10-CM

## 2023-04-18 ENCOUNTER — Telehealth: Payer: Self-pay

## 2023-04-18 DIAGNOSIS — F900 Attention-deficit hyperactivity disorder, predominantly inattentive type: Secondary | ICD-10-CM

## 2023-04-18 MED ORDER — METHYLPHENIDATE HCL ER (OSM) 54 MG PO TBCR
54.0000 mg | EXTENDED_RELEASE_TABLET | ORAL | 0 refills | Status: DC
Start: 2023-04-18 — End: 2023-06-24

## 2023-04-18 NOTE — Telephone Encounter (Signed)
I have sent methylphenidate 54 mg to pharmacy.

## 2023-04-18 NOTE — Telephone Encounter (Signed)
  pt left message that she needs a refill on the methylphenidate 54mg . pt was last seen on 7-10 next appt 9-11     Disp Refills Start End   methylphenidate 54 MG PO CR tablet 30 tablet 0 03/17/2023 --   Sig - Route: Take 1 tablet (54 mg total) by mouth every morning. Take along with 18 mg daily - Oral   Sent to pharmacy as: methylphenidate (CONCERTA) 54 MG CR tablet   Earliest Fill Date: 03/17/2023   Notes to Pharmacy: Fill on or after 03/17/2023   E-Prescribing Status: Receipt confirmed by pharmacy (02/22/2023  1:45 PM EDT)

## 2023-04-19 NOTE — Telephone Encounter (Signed)
left message that rx was sent into the pharmacy

## 2023-06-01 ENCOUNTER — Ambulatory Visit (INDEPENDENT_AMBULATORY_CARE_PROVIDER_SITE_OTHER): Payer: BC Managed Care – PPO | Admitting: Psychiatry

## 2023-06-01 DIAGNOSIS — F900 Attention-deficit hyperactivity disorder, predominantly inattentive type: Secondary | ICD-10-CM

## 2023-06-01 NOTE — Progress Notes (Signed)
Patient unable to do appointment.

## 2023-06-01 NOTE — Telephone Encounter (Signed)
Noted  

## 2023-06-24 ENCOUNTER — Telehealth: Payer: Self-pay | Admitting: Psychiatry

## 2023-06-24 DIAGNOSIS — F900 Attention-deficit hyperactivity disorder, predominantly inattentive type: Secondary | ICD-10-CM

## 2023-06-24 MED ORDER — METHYLPHENIDATE HCL ER (OSM) 18 MG PO TBCR: 18.0000 mg | EXTENDED_RELEASE_TABLET | ORAL | 0 refills | Status: DC

## 2023-06-24 MED ORDER — METHYLPHENIDATE HCL ER (OSM) 54 MG PO TBCR: 54.0000 mg | EXTENDED_RELEASE_TABLET | ORAL | 0 refills | Status: DC

## 2023-06-24 MED ORDER — METHYLPHENIDATE HCL 5 MG PO TABS: 5.0000 mg | ORAL_TABLET | Freq: Every day | ORAL | 0 refills | Status: DC

## 2023-06-24 NOTE — Telephone Encounter (Signed)
I have sent Concerta 18 mg, 54 mg and 5 mg to pharmacy.

## 2023-07-14 ENCOUNTER — Other Ambulatory Visit: Payer: Self-pay | Admitting: Dermatology

## 2023-07-14 DIAGNOSIS — L739 Follicular disorder, unspecified: Secondary | ICD-10-CM

## 2023-07-26 ENCOUNTER — Ambulatory Visit: Payer: BC Managed Care – PPO | Admitting: Psychiatry

## 2023-07-26 ENCOUNTER — Encounter: Payer: Self-pay | Admitting: Psychiatry

## 2023-07-26 VITALS — BP 110/76 | HR 76 | Temp 98.0°F | Ht 64.0 in | Wt 153.6 lb

## 2023-07-26 DIAGNOSIS — F900 Attention-deficit hyperactivity disorder, predominantly inattentive type: Secondary | ICD-10-CM

## 2023-07-26 DIAGNOSIS — Z8659 Personal history of other mental and behavioral disorders: Secondary | ICD-10-CM | POA: Diagnosis not present

## 2023-07-26 MED ORDER — METHYLPHENIDATE HCL 5 MG PO TABS
5.0000 mg | ORAL_TABLET | Freq: Every day | ORAL | 0 refills | Status: DC
Start: 2023-08-16 — End: 2024-03-09

## 2023-07-26 NOTE — Progress Notes (Unsigned)
BH MD OP Progress Note  07/26/2023 4:48 PM Marilyn Norris  MRN:  [id number]  Chief Complaint:  Chief Complaint  Patient presents with   Follow-up   Anxiety   ADHD   Medication Refill   HPI: Marilyn Norris is a 42 year old Caucasian female, employed, married, [place], has a history of ADHD, inattentive type, depression was evaluated in office today.  Patient today reports she is currently overall doing fairly well with regards to her ADHD symptoms.  The Concerta at this current dosage does help.  She uses the Ritalin 5 mg only as needed later on during the day.  That does help when she has a longer day.  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  She reports her workload is not as bad as last year since she has not taken up a whole lot of extra work this year.  That is an improvement.  She also does not have a second job this year.  She continues to have situational stressors which can make her irritable and on edge.  She however is aware that it is just situational and not really because of her depression.  She does not feel depressed.  Anxiety is manageable.  Patient reports sleep is overall good.  Patient denies any side effects to medications.  Denies any suicidality, homicidality or perceptual disturbances.  Patient denies any other concerns today.  Visit Diagnosis:    ICD-10-CM   1. Attention deficit hyperactivity disorder (ADHD), predominantly inattentive type  F90.0 methylphenidate (RITALIN) 5 MG tablet    2. History of depression  Z86.59       Past Psychiatric History: I have reviewed past psychiatric history from progress note on 06/26/2020.  Past trials of Adderall, Concerta, Ritalin.  Past Medical History:  Past Medical History:  Diagnosis Date   Adult ADHD    ADD   Dysplastic nevus 04/12/2018   R inf breast - mild   Dysplastic nevus 04/12/2018   L mid to upper back 4.0 cm lat to spine - severe, excision 11/14/2018   Dysplastic nevus  01/11/2019   R med sup pubic area - mild    Past Surgical History:  Procedure Laterality Date   CESAREAN SECTION  07/03/2009   HERNIA REPAIR     TONSILLECTOMY AND ADENOIDECTOMY     27-98 years old     Family Psychiatric History: Reviewed family psychiatric history from progress note on 06/26/2020.  Family History:  Family History  Problem Relation Age of Onset   Thyroid disease Mother    Cancer Father        Kidney    Thyroid disease Sister    Bipolar disorder Maternal Uncle    Breast cancer Neg Hx     Social History: Reviewed social history from progress note on 06/26/2020. Social History   Socioeconomic History   Marital status: Married    Spouse name: Not on file   Number of children: Not on file   Years of education: Not on file   Highest education level: Not on file  Occupational History   Not on file  Tobacco Use   Smoking status: Never   Smokeless tobacco: Never  Vaping Use   Vaping status: Never Used  Substance and Sexual Activity   Alcohol use: Yes    Comment: SOCIAL   Drug use: No   Sexual activity: Yes    Birth control/protection: I.U.D.  Other Topics Concern   Not on file  Social History  Narrative   Not on file   Social Determinants of Health   Financial Resource Strain: Low Risk  (12/20/2022)   Received from [facility], [facility]   Overall Financial Resource Strain (CARDIA)    Difficulty of Paying Living Expenses: Not hard at all  Food Insecurity: No Food Insecurity (12/20/2022)   Received from [facility], [facility]   Hunger Vital Sign    Worried About Running Out of Food in the Last Year: Never true    Ran Out of Food in the Last Year: Never true  Transportation Needs: No Transportation Needs (12/20/2022)   Received from [facility], [facility]   [facility] - Transportation    In the past 12 months, has lack of transportation kept you  from medical appointments or from getting medications?: No    Lack of Transportation (Non-Medical): No  Physical Activity: Insufficiently Active (12/20/2022)   Received from [facility], [facility]   Exercise Vital Sign    Days of Exercise per Week: 2 days    Minutes of Exercise per Session: 20 min  Stress: No Stress Concern Present (12/20/2022)   Received from [facility], [facility]   [employer] of Occupational Health - Occupational Stress Questionnaire    Feeling of Stress : Not at all  Social Connections: Socially Integrated (12/20/2022)   Received from [facility], [facility]   Social Connection and Isolation Panel [NHANES]    Frequency of Communication with Friends and Family: More than three times a week    Frequency of Social Gatherings with Friends and Family: Once a week    Attends Religious Services: More than 4 times per year    Active Member of Clubs or Organizations: Yes    Attends Engineer, structural: More than 4 times per year    Marital Status: Married    Allergies:  Allergies  Allergen Reactions   Morphine Itching   Morphine And Codeine Itching    Metabolic Disorder Labs: No results found for: "HGBA1C", "MPG" No results found for: "PROLACTIN" No results found for: "CHOL", "TRIG", "HDL", "CHOLHDL", "VLDL", "LDLCALC" Lab Results  Component Value Date   TSH 1.070 09/12/2017    Therapeutic Level Labs: No results found for: "LITHIUM" No results found for: "VALPROATE" No results found for: "CBMZ"  Current Medications: Current Outpatient Medications  Medication Sig Dispense Refill   doxycycline (PERIOSTAT) 20 MG tablet TAKE 1 TABLET BY MOUTH TWICE A DAY 180 tablet 0   hydrocortisone 2.5 % cream Apply topically.     methylphenidate (CONCERTA) 18 MG PO CR tablet Take 1 tablet (18 mg total) by mouth every morning. Take along with 54 mg daily  90 tablet 0   methylphenidate (RITALIN) 5 MG tablet Take 1 tablet (5 mg total) by mouth daily after lunch. 30 tablet 0   methylphenidate (RITALIN) 5 MG tablet Take 1 tablet (5 mg total) by mouth daily after lunch. 30 tablet 0   methylphenidate 54 MG PO CR tablet Take 1 tablet (54 mg total) by mouth every morning. Take along with 18 mg daily 90 tablet 0   mometasone (ELOCON) 0.1 % cream Apply 1 Application topically daily as needed (Rash). 45 g 3   Levonorgestrel (KYLEENA) 19.5 MG IUD 1 Device by Intrauterine route once for 1 dose. 1 Intra Uterine Device 0   [START ON 08/16/2023] methylphenidate (  RITALIN) 5 MG tablet Take 1 tablet (5 mg total) by mouth daily after lunch. 90 tablet 0   No current facility-administered medications for this visit.     Musculoskeletal: Strength & Muscle Tone: within normal limits Gait & Station: normal Patient leans: N/A  Psychiatric Specialty Exam: Review of Systems  Psychiatric/Behavioral:         Irritable at times likely situational.    Blood pressure 110/76, pulse 76, temperature 98 F (36.7 C), temperature source Skin, height 5\' 4"  (1.626 m), weight 153 lb 9.6 oz (69.7 kg).Body mass index is 26.37 kg/m.  General Appearance: Fairly Groomed  Eye Contact:  Good  Speech:  Clear and Coherent  Volume:  Normal  Mood:  Euthymic, does report irritability due to certain situations currently denies it.  Affect:  Congruent  Thought Process:  Goal Directed and Descriptions of Associations: Intact  Orientation:  Full (Time, Place, and Person)  Thought Content: Logical   Suicidal Thoughts:  No  Homicidal Thoughts:  No  Memory:  Immediate;   Fair Recent;   Fair Remote;   Fair  Judgement:  Fair  Insight:  Fair  Psychomotor Activity:  Normal  Concentration:  Concentration: Fair and Attention Span: Fair  Recall:  [employer] of Knowledge: Fair  Language: Fair  Akathisia:  No  Handed:  Right  AIMS (if indicated): not done  Assets:  Communication  Skills Desire for Improvement Housing Social Support  ADL's:  Intact  Cognition: WNL  Sleep:  Fair   Screenings: PHQ2-9    Flowsheet Row Office Visit from 07/26/2023 in [facility] Video Visit from 10/21/2022 in [facility] Video Visit from 05/13/2022 in [facility] Office Visit from 02/17/2022 in [facility] Video Visit from 01/26/2022 in [facility]  PHQ-2 Total Score 0 0 0 0 0  PHQ-9 Total Score -- -- -- 1 --      Flowsheet Row Office Visit from 07/26/2023 in [facility] Video Visit from 03/30/2023 in [facility] Video Visit from 10/21/2022 in [facility]  C-SSRS RISK CATEGORY No Risk No Risk No Risk        Assessment and Plan: Marilyn Norris is a 42 year old Caucasian female, employed, married, has a history of ADHD was evaluated in office today.  Patient is currently stable.  Plan ADHD inattentive type-stable Concerta 72 mg p.o. daily in the morning Ritalin 5 mg p.o. daily in the afternoon. Reviewed [place] PMP AWARxE We will consider getting a urine drug screen in the future. Reviewed [place] PMP AWARxE  Follow-up in clinic in 6 months or sooner if needed.   Consent: Patient/Guardian gives verbal consent for treatment and assignment of benefits for services provided during this visit. Patient/Guardian expressed understanding and agreed to proceed.    Jomarie Longs, MD 07/26/2023, 4:48 PM

## 2023-07-27 ENCOUNTER — Encounter: Payer: Self-pay | Admitting: Psychiatry

## 2023-07-28 ENCOUNTER — Encounter: Payer: Self-pay | Admitting: Dermatology

## 2023-07-28 ENCOUNTER — Ambulatory Visit: Payer: BC Managed Care – PPO | Admitting: Dermatology

## 2023-07-28 DIAGNOSIS — D179 Benign lipomatous neoplasm, unspecified: Secondary | ICD-10-CM | POA: Diagnosis not present

## 2023-07-28 DIAGNOSIS — L816 Other disorders of diminished melanin formation: Secondary | ICD-10-CM | POA: Diagnosis not present

## 2023-07-28 DIAGNOSIS — L739 Follicular disorder, unspecified: Secondary | ICD-10-CM | POA: Diagnosis not present

## 2023-07-28 DIAGNOSIS — L578 Other skin changes due to chronic exposure to nonionizing radiation: Secondary | ICD-10-CM

## 2023-07-28 DIAGNOSIS — D1730 Benign lipomatous neoplasm of skin and subcutaneous tissue of unspecified sites: Secondary | ICD-10-CM

## 2023-07-28 DIAGNOSIS — L820 Inflamed seborrheic keratosis: Secondary | ICD-10-CM

## 2023-07-28 DIAGNOSIS — W908XXA Exposure to other nonionizing radiation, initial encounter: Secondary | ICD-10-CM

## 2023-07-28 DIAGNOSIS — Z7189 Other specified counseling: Secondary | ICD-10-CM

## 2023-07-28 DIAGNOSIS — Z79899 Other long term (current) drug therapy: Secondary | ICD-10-CM

## 2023-07-28 DIAGNOSIS — L818 Other specified disorders of pigmentation: Secondary | ICD-10-CM

## 2023-07-28 NOTE — Progress Notes (Signed)
Follow-Up Visit   Subjective  Marilyn Norris is a 42 y.o. female who presents for the following: Spots on torso. Raised, rough. Irritated. Increasing in number.   Check legs. Thinks could be molluscum. Daughter has MC they have been treating.  Over the summer she has noticed small spots similar to [facility] on her legs.   6 month follow up. Folliculitis. Chest. Takes Doxycycline 20 mg once daily. Has recently used SM wash.  States it feels rough but looks better. Stable  The patient has spots, moles and lesions to be evaluated, some may be new or changing and the patient may have concern these could be cancer.    The following portions of the chart were reviewed this encounter and updated as appropriate: medications, allergies, medical history  Review of Systems:  No other skin or systemic complaints except as noted in HPI or Assessment and Plan.  Objective  Well appearing patient in no apparent distress; mood and affect are within normal limits.  A focused examination was performed of the following areas: Torso, legs  Relevant physical exam findings are noted in the Assessment and Plan.  left neck x1, torso >30 (31) Erythematous keratotic or waxy stuck-on papule or plaque.    Assessment & Plan   Inflamed seborrheic keratosis (31) left neck x1, torso >30  Symptomatic, irritating, patient would like treated.  Destruction of lesion - left neck x1, torso >30 (31) Complexity: simple   Destruction method: cryotherapy   Informed consent: discussed and consent obtained   Timeout:  patient name, date of birth, surgical site, and procedure verified Lesion destroyed using liquid nitrogen: Yes   Region frozen until ice ball extended beyond lesion: Yes   Outcome: patient tolerated procedure well with no complications   Post-procedure details: wound care instructions given   Additional details:  Prior to procedure, discussed risks of blister formation, small wound, skin  dyspigmentation, or rare scar following cryotherapy. Recommend Vaseline ointment to treated areas while healing.   FOLLICULITIS Exam: Perifollicular erythematous papules; few at chest  Folliculitis occurs due to inflammation of the superficial hair follicle (pore), resulting in acne-like lesions (pus bumps). It can be infectious (bacterial, fungal) or noninfectious (shaving, tight clothing, heat/sweat, medications).  Folliculitis can be acute or chronic and recommended treatment depends on the underlying cause of folliculitis.  Treatment Plan: Continue doxycycline 20 mg 1 tablet daily in the evening with food and plenty of fluid   Doxycycline should be taken with food to prevent nausea. Do not lay down for 30 minutes after taking. Be cautious with sun exposure and use good sun protection while on this medication. Pregnant women should not take this medication.    Idiopathic Guttate Hypomelanosis (IGH)  Exam: scattered small hypopigmented macules at legs  IGH is a benign chronic condition of sun-exposed skin, most commonly affecting the arms and legs. Cause is unknown but it can be a genetic trait. It is not related to vitiligo. There is no treatment. Recommend photoprotection and regular use of spf 30 or higher sunscreen which may help prevent the development of more white spots.  Treatment Plan:  Benign, observe.      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  Observation.  Call clinic for new or changing lesions.   ACTINIC DAMAGE - chronic, secondary to cumulative UV radiation exposure/sun exposure over time - diffuse scaly erythematous macules with underlying dyspigmentation - Recommend daily broad spectrum  sunscreen SPF 30+ to sun-exposed areas, reapply every 2 hours as needed.  - Recommend staying in the shade or wearing long sleeves, sun glasses (UVA+UVB protection) and wide brim hats  (4-inch brim around the entire circumference of the hat). - Call for new or changing lesions.  Return in about 1 year (around 07/27/2024) for TBSE, HxDN.  I, Lawson Radar, CMA, am acting as scribe for Armida Sans, MD.   Documentation: I have reviewed the above documentation for accuracy and completeness, and I agree with the above.  Armida Sans, MD

## 2023-07-28 NOTE — Patient Instructions (Addendum)
Cryotherapy Aftercare  Wash gently with soap and water everyday.   Apply Vaseline Jelly daily until healed.    Wound Care Instructions  Cleanse wound gently with soap and water once a day then pat dry with clean gauze. Apply a thin coat of Petrolatum (petroleum jelly, "Vaseline") over the wound (unless you have an allergy to this). We recommend that you use a new, sterile tube of Vaseline. Do not pick or remove scabs. Do not remove the yellow or white "healing tissue" from the base of the wound.  Cover the wound with fresh, clean, nonstick gauze and secure with paper tape. You may use Band-Aids in place of gauze and tape if the wound is small enough, but would recommend trimming much of the tape off as there is often too much. Sometimes Band-Aids can irritate the skin.  You should call the office for your biopsy report after 1 week if you have not already been contacted.  If you experience any problems, such as abnormal amounts of bleeding, swelling, significant bruising, significant pain, or evidence of infection, please call the office immediately.  FOR ADULT SURGERY PATIENTS: If you need something for pain relief you may take 1 extra strength Tylenol (acetaminophen) AND 2 Ibuprofen (200mg  each) together every 4 hours as needed for pain. (do not take these if you are allergic to them or if you have a reason you should not take them.) Typically, you may only need pain medication for 1 to 3 days.     Seborrheic Keratosis  What causes seborrheic keratoses? Seborrheic keratoses are harmless, common skin growths that first appear during adult life.  As time goes by, more growths appear.  Some people may develop a large number of them.  Seborrheic keratoses appear on both covered and uncovered body parts.  They are not caused by sunlight.  The tendency to develop seborrheic keratoses can be inherited.  They vary in color from skin-colored to gray, brown, or even black.  They can be either smooth  or have a rough, warty surface.   Seborrheic keratoses are superficial and look as if they were stuck on the skin.  Under the microscope this type of keratosis looks like layers upon layers of skin.  That is why at times the top layer may seem to fall off, but the rest of the growth remains and re-grows.    Treatment Seborrheic keratoses do not need to be treated, but can easily be removed in the office.  Seborrheic keratoses often cause symptoms when they rub on clothing or jewelry.  Lesions can be in the way of shaving.  If they become inflamed, they can cause itching, soreness, or burning.  Removal of a seborrheic keratosis can be accomplished by freezing, burning, or surgery. If any spot bleeds, scabs, or grows rapidly, please return to have it checked, as these can be an indication of a skin cancer.   Recommend daily broad spectrum sunscreen SPF 30+ to sun-exposed areas, reapply every 2 hours as needed. Call for new or changing lesions.  Staying in the shade or wearing long sleeves, sun glasses (UVA+UVB protection) and wide brim hats (4-inch brim around the entire circumference of the hat) are also recommended for sun protection.     Due to recent changes in healthcare laws, you may see results of your pathology and/or laboratory studies on MyChart before the doctors have had a chance to review them. We understand that in some cases there may be results that are confusing or  concerning to you. Please understand that not all results are received at the same time and often the doctors may need to interpret multiple results in order to provide you with the best plan of care or course of treatment. Therefore, we ask that you please give [place] 2 business days to thoroughly review all your results before contacting the office for clarification. Should we see a critical lab result, you will be contacted sooner.   If You Need Anything After Your Visit  If you have any questions or concerns for your  doctor, please call our main line at [phone] and press option 4 to reach your doctor's medical assistant. If no one answers, please leave a voicemail as directed and we will return your call as soon as possible. Messages left after 4 pm will be answered the following business day.   You may also send [place] a message via MyChart. We typically respond to MyChart messages within 1-2 business days.  For prescription refills, please ask your pharmacy to contact our office. Our fax number is [phone].  If you have an urgent issue when the clinic is closed that cannot wait until the next business day, you can page your doctor at the number below.    Please note that while we do our best to be available for urgent issues outside of office hours, we are not available 24/7.   If you have an urgent issue and are unable to reach [place], you may choose to seek medical care at your doctor's office, retail clinic, urgent care center, or emergency room.  If you have a medical emergency, please immediately call 911 or go to the emergency department.  Pager Numbers  - Dr. Gwen Pounds: [phone]  - Dr. Roseanne Reno: [phone]  - Dr. Katrinka Blazing: [phone]   In the event of inclement weather, please call our main line at [phone] for an update on the status of any delays or closures.  Dermatology Medication Tips: Please keep the boxes that topical medications come in in order to help keep track of the instructions about where and how to use these. Pharmacies typically print the medication instructions only on the boxes and not directly on the medication tubes.   If your medication is too expensive, please contact our office at [phone] option 4 or send [place] a message through MyChart.   We are unable to tell what your co-pay for medications will be in advance as this is different depending on your insurance coverage. However, we may be able to find a substitute medication at lower cost or fill out  paperwork to get insurance to cover a needed medication.   If a prior authorization is required to get your medication covered by your insurance company, please allow [place] 1-2 business days to complete this process.  Drug prices often vary depending on where the prescription is filled and some pharmacies may offer cheaper prices.  The website www.goodrx.com contains coupons for medications through different pharmacies. The prices here do not account for what the cost may be with help from insurance (it may be cheaper with your insurance), but the website can give you the price if you did not use any insurance.  - You can print the associated coupon and take it with your prescription to the pharmacy.  - You may also stop by our office during regular business hours and pick up a GoodRx coupon card.  - If you need your prescription sent electronically to a different pharmacy, notify our office  through [facility] or by phone at [phone] option 4.     Si Usted Necesita Algo Despus de Su Visita  Tambin puede enviarnos un mensaje a travs de Clinical cytogeneticist. Por lo general respondemos a los mensajes de MyChart en el transcurso de 1 a 2 das hbiles.  Para renovar recetas, por favor pida a su farmacia que se ponga en contacto con nuestra oficina. Annie Sable de fax es [place] [phone].  Si tiene un asunto urgente cuando la clnica est cerrada y que no puede esperar hasta el siguiente da hbil, puede llamar/localizar a su doctor(a) al nmero que aparece a continuacin.   Por favor, tenga en cuenta que aunque hacemos todo lo posible para estar disponibles para asuntos urgentes fuera del horario de [place], no estamos disponibles las 24 horas del da, los 7 [address]  [address] de la [place].   Si tiene un problema urgente y no puede comunicarse con nosotros, puede optar por buscar atencin mdica  en el consultorio de su doctor(a), en una clnica privada, en un centro de atencin urgente o en una sala de  emergencias.  Si tiene Engineer, drilling, por favor llame inmediatamente al 911 o vaya a la sala de emergencias.  Nmeros de bper  - Dr. Gwen Pounds: [phone]  - Dra. Roseanne Reno: [phone]  - Dr. Katrinka Blazing: [phone]   En caso de inclemencias del tiempo, por favor llame a Lacy Duverney principal al [phone] para una actualizacin sobre el [place] de cualquier retraso o cierre.  Consejos para la medicacin en dermatologa: Por favor, guarde las cajas en las que vienen los medicamentos de uso tpico para ayudarle a seguir las instrucciones sobre dnde y cmo usarlos. Las farmacias generalmente imprimen las instrucciones del medicamento slo en las cajas y no directamente en los tubos del [place].   Si su medicamento es muy caro, por favor, pngase en contacto con Rolm Gala llamando al [phone] y presione la opcin 4 o envenos un mensaje a travs de Clinical cytogeneticist.   No podemos decirle cul ser su copago por los medicamentos por adelantado ya que esto es diferente dependiendo de la cobertura de su seguro. Sin embargo, es posible que podamos encontrar un medicamento sustituto a Audiological scientist un formulario para que el seguro cubra el medicamento que se considera necesario.   Si se requiere una autorizacin previa para que su compaa de seguros [place] su medicamento, por favor permtanos de 1 a 2 das hbiles para completar [address]  Los precios de los medicamentos varan con frecuencia dependiendo del Environmental consultant de dnde se surte la receta y alguna farmacias pueden ofrecer precios ms baratos.  El sitio web www.goodrx.com tiene cupones para medicamentos de Health and safety inspector. Los precios aqu no tienen en cuenta lo que podra costar con la ayuda del seguro (puede ser ms barato con su seguro), pero el sitio web puede darle el precio si no utiliz Tourist information centre manager.  - Puede imprimir el cupn correspondiente y llevarlo con su receta a la farmacia.  - Tambin puede pasar por  nuestra oficina durante el horario de atencin regular y Education officer, museum una tarjeta de cupones de GoodRx.  - Si necesita que su receta se enve electrnicamente a una farmacia diferente, informe a nuestra oficina a travs de MyChart de [employer] o por telfono llamando al [phone] y presione la opcin 4.

## 2023-10-11 ENCOUNTER — Other Ambulatory Visit: Payer: Self-pay | Admitting: Psychiatry

## 2023-10-12 ENCOUNTER — Other Ambulatory Visit: Payer: Self-pay | Admitting: Dermatology

## 2023-10-12 ENCOUNTER — Other Ambulatory Visit: Payer: Self-pay | Admitting: Psychiatry

## 2023-10-12 DIAGNOSIS — F900 Attention-deficit hyperactivity disorder, predominantly inattentive type: Secondary | ICD-10-CM

## 2023-10-12 DIAGNOSIS — L739 Follicular disorder, unspecified: Secondary | ICD-10-CM

## 2023-10-12 MED ORDER — METHYLPHENIDATE HCL ER (OSM) 54 MG PO TBCR: 54.0000 mg | EXTENDED_RELEASE_TABLET | ORAL | 0 refills | Status: DC

## 2023-10-12 MED ORDER — METHYLPHENIDATE HCL ER (OSM) 18 MG PO TBCR
18.0000 mg | EXTENDED_RELEASE_TABLET | ORAL | 0 refills | Status: DC
Start: 2023-10-12 — End: 2024-01-20

## 2023-10-12 NOTE — Telephone Encounter (Signed)
I have sent methylphenidate 54 mg and 18 mg to pharmacy.

## 2023-10-13 ENCOUNTER — Ambulatory Visit
Admission: RE | Admit: 2023-10-13 | Discharge: 2023-10-13 | Disposition: A | Discharge status: Home / Self Care | Payer: 59 | Source: Ambulatory Visit | Attending: Physician Assistant | Admitting: Physician Assistant

## 2023-10-13 VITALS — BP 113/78 | HR 79 | Temp 99.1°F | Ht 64.0 in | Wt 156.0 lb

## 2023-10-13 DIAGNOSIS — B354 Tinea corporis: Secondary | ICD-10-CM

## 2023-10-13 DIAGNOSIS — L0889 Other specified local infections of the skin and subcutaneous tissue: Secondary | ICD-10-CM

## 2023-10-13 MED ORDER — DOXYCYCLINE HYCLATE 100 MG PO CAPS: 100.0000 mg | ORAL_CAPSULE | Freq: Two times a day (BID) | ORAL | 0 refills | Status: AC

## 2023-10-13 MED ORDER — TERBINAFINE HCL 1 % EX CREA: 1.0000 | TOPICAL_CREAM | Freq: Two times a day (BID) | CUTANEOUS | 0 refills | Status: AC

## 2023-10-13 MED ORDER — FLUCONAZOLE 150 MG PO TABS
150.0000 mg | ORAL_TABLET | ORAL | 0 refills | Status: AC
Start: 2023-10-13 — End: ?

## 2023-10-13 NOTE — ED Provider Notes (Signed)
[facility]    CSN: [id number] Arrival date & time: 10/13/23  1647      History   Chief Complaint Chief Complaint  Patient presents with   Rash    HPI Marilyn Norris is a 43 y.o. female for 8-day history of pruritic circular rash of the left ankle.  Denies associated pain.  Denies any known insect bites or stings.  Has tried and antifungal spray very briefly, topical mupirocin ointment, topical corticosteroid without relief.  States it seems to be any worse.  No other areas of rash.  History of a lot of skin issues and takes low-dose doxycycline 20 mg daily as needed for folliculitis, inflamed seborrheic keratoses.  HPI  Past Medical History:  Diagnosis Date   Adult ADHD    ADD   Dysplastic nevus 04/12/2018   R inf breast - mild   Dysplastic nevus 04/12/2018   L mid to upper back 4.0 cm lat to spine - severe, excision 11/14/2018   Dysplastic nevus 01/11/2019   R med sup pubic area - mild    Patient Active Problem List   Diagnosis Date Noted   Cognitive disorder 06/30/2021   Attention deficit hyperactivity disorder (ADHD), predominantly inattentive type 06/26/2020   History of depression 06/26/2020   VBAC, delivered, current hospitalization 04/27/2014   Contraception 04/18/2014   Group B Streptococcus carrier, +RV culture, currently pregnant 04/18/2014   History of successful vaginal birth after cesarean, currently pregnant 04/18/2014   Other social stressor 04/18/2014   Immunization counseling 04/18/2014    Past Surgical History:  Procedure Laterality Date   CESAREAN SECTION  07/03/2009   HERNIA REPAIR     TONSILLECTOMY AND ADENOIDECTOMY     23-25 years old     OB History     Gravida  3   Para  3   Term  3   Preterm      AB      Living  3      SAB      IAB      Ectopic      Multiple      Live Births  3            Home Medications    Prior to Admission medications   Medication Sig Start Date End Date Taking?  Authorizing Provider  doxycycline (PERIOSTAT) 20 MG tablet TAKE 1 TABLET BY MOUTH TWICE A DAY 10/12/23  Yes Deirdre Evener, MD  doxycycline (VIBRAMYCIN) 100 MG capsule Take 1 capsule (100 mg total) by mouth 2 (two) times daily for 7 days. 10/13/23 10/20/23 Yes Shirlee Latch, PA-C  fluconazole (DIFLUCAN) 150 MG tablet Take 1 tablet (150 mg total) by mouth once a week. 10/13/23  Yes Eusebio Friendly B, PA-C  hydrocortisone 2.5 % cream Apply topically. 08/08/20  Yes [provider]  methylphenidate (CONCERTA) 18 MG PO CR tablet Take 1 tablet (18 mg total) by mouth every morning. Take along with 54 mg daily 10/12/23  Yes Eappen, Levin Bacon, MD  methylphenidate (RITALIN) 5 MG tablet Take 1 tablet (5 mg total) by mouth daily after lunch. 01/09/23  Yes Eappen, Levin Bacon, MD  methylphenidate (RITALIN) 5 MG tablet Take 1 tablet (5 mg total) by mouth daily after lunch. 07/15/23  Yes Jomarie Longs, MD  methylphenidate (RITALIN) 5 MG tablet Take 1 tablet (5 mg total) by mouth daily after lunch. 08/16/23  Yes Jomarie Longs, MD  methylphenidate 54 MG PO CR tablet Take 1 tablet (54 mg total)  by mouth every morning. Take along with 18 mg daily 10/12/23  Yes Eappen, Saramma, MD  mometasone (ELOCON) 0.1 % cream Apply 1 Application topically daily as needed (Rash). 10/13/22  Yes Deirdre Evener, MD  terbinafine (LAMISIL) 1 % cream Apply 1 Application topically 2 (two) times daily for 7 days. 10/13/23 10/20/23 Yes Shirlee Latch, PA-C  Levonorgestrel (KYLEENA) 19.5 MG IUD 1 Device by Intrauterine route once for 1 dose. 09/12/17 06/26/20  Conard Novak, MD    Family History Family History  Problem Relation Age of Onset   Thyroid disease Mother    Cancer Father        Kidney    Thyroid disease Sister    Bipolar disorder Maternal Uncle    ADD / ADHD Daughter    Breast cancer Neg Hx     Social History Social History   Tobacco Use   Smoking status: Never   Smokeless tobacco: Never  Vaping Use    Vaping status: Never Used  Substance Use Topics   Alcohol use: Yes    Comment: SOCIAL   Drug use: No     Allergies   Morphine and Morphine and codeine   Review of Systems Review of Systems  Constitutional:  Negative for fatigue and fever.  Skin:  Positive for color change and rash.  Neurological:  Negative for weakness.     Physical Exam Triage Vital Signs ED Triage Vitals  Encounter Vitals Group     BP      Systolic BP Percentile      Diastolic BP Percentile      Pulse      Resp      Temp      Temp src      SpO2      Weight      Height      Head Circumference      Peak Flow      Pain Score      Pain Loc      Pain Education      Exclude from Growth Chart    No data found.  Updated Vital Signs BP 113/78 (BP Location: Left Arm)   Pulse 79   Temp 99.1 F (37.3 C) (Oral)   Ht 5\' 4"  (1.626 m)   Wt 156 lb (70.8 kg)   SpO2 98%   BMI 26.78 kg/m     Physical Exam Vitals and nursing note reviewed.  Constitutional:      General: She is not in acute distress.    Appearance: Normal appearance. She is not ill-appearing or toxic-appearing.  HENT:     Head: Normocephalic and atraumatic.  Eyes:     General: No scleral icterus.       Right eye: No discharge.        Left eye: No discharge.     Conjunctiva/sclera: Conjunctivae normal.  Cardiovascular:     Rate and Rhythm: Normal rate.  Pulmonary:     Effort: Pulmonary effort is normal. No respiratory distress.  Musculoskeletal:     Cervical back: Neck supple.  Skin:    General: Skin is dry.     Findings: Rash present.  Neurological:     General: No focal deficit present.     Mental Status: She is alert. Mental status is at baseline.     Motor: No weakness.     Gait: Gait normal.  Psychiatric:        Mood and Affect: Mood normal.  Behavior: Behavior normal.      UC Treatments / Results  Labs (all labs ordered are listed, but only abnormal results are displayed) Labs Reviewed - No data to  display  EKG   Radiology No results found.  Procedures Procedures (including critical care time)  Medications Ordered in UC Medications - No data to display  Initial Impression / Assessment and Plan / UC Course  I have reviewed the triage vital signs and the nursing notes.  Pertinent labs & imaging results that were available during my care of the patient were reviewed by me and considered in my medical decision making (see chart for details).   43 year old female presents for a day history of pruritic rash of the left ankle.  Rash has not gotten better with application of mupirocin ointment, antifungal spray, or topical corticosteroid.  Also takes low-dose doxycycline 20 mg daily for other condition.  See image included in chart.  Consistent with secondarily infected tinea corporis.  Will treat at this time with topical Lamisil, oral Diflucan, and also increase doxycycline to 100 mg twice daily for the next week.  Discussed supportive care, good hygiene.  Advised to follow-up with [place], PCP or dermatology if not improving over the next week or 2 or if symptoms worsen.   Final Clinical Impressions(s) / UC Diagnoses   Final diagnoses:  Tinea corporis  Secondary infection of skin     Discharge Instructions      -Rash is most consistent with body ringworm.  I think there could also be a secondary bacterial infection. - Begin topical and topical cream and oral antifungal medication.  I also sent doxycycline to take twice a day at a higher dose. - If not improving with this over the next couple weeks or symptoms worsen may need to return for reevaluation and consideration of a different treatment plan.     ED Prescriptions     Medication Sig Dispense Auth. Provider   terbinafine (LAMISIL) 1 % cream Apply 1 Application topically 2 (two) times daily for 7 days. 14 g Eusebio Friendly B, PA-C   fluconazole (DIFLUCAN) 150 MG tablet Take 1 tablet (150 mg total) by mouth once a week. 4  tablet Eusebio Friendly B, PA-C   doxycycline (VIBRAMYCIN) 100 MG capsule Take 1 capsule (100 mg total) by mouth 2 (two) times daily for 7 days. 14 capsule Shirlee Latch, PA-C      PDMP not reviewed this encounter.   Shirlee Latch, PA-C 10/13/23 1744

## 2023-10-13 NOTE — Discharge Instructions (Addendum)
-  Rash is most consistent with body ringworm.  I think there could also be a secondary bacterial infection. - Begin topical and topical cream and oral antifungal medication.  I also sent doxycycline to take twice a day at a higher dose. - If not improving with this over the next couple weeks or symptoms worsen may need to return for reevaluation and consideration of a different treatment plan.

## 2023-10-13 NOTE — ED Triage Notes (Signed)
Pt c/o Bright red round rash along the medial lower left leg x11days  Pt states that she first noticed the rash along January 15th and the rash was itching.  Pt denies any insect or tick bites.  Pt states that the rash has not appeared anywhere else and has stayed on the lower leg.

## 2024-01-20 ENCOUNTER — Other Ambulatory Visit: Payer: Self-pay | Admitting: Psychiatry

## 2024-01-20 DIAGNOSIS — F900 Attention-deficit hyperactivity disorder, predominantly inattentive type: Secondary | ICD-10-CM

## 2024-01-20 MED ORDER — METHYLPHENIDATE HCL 5 MG PO TABS: 5.0000 mg | ORAL_TABLET | Freq: Every day | ORAL | 0 refills | Status: DC

## 2024-01-20 MED ORDER — METHYLPHENIDATE HCL ER (OSM) 54 MG PO TBCR: 54.0000 mg | EXTENDED_RELEASE_TABLET | ORAL | 0 refills | Status: DC

## 2024-01-20 MED ORDER — METHYLPHENIDATE HCL ER (OSM) 18 MG PO TBCR: 18.0000 mg | EXTENDED_RELEASE_TABLET | ORAL | 0 refills | Status: DC

## 2024-01-20 NOTE — Telephone Encounter (Signed)
 I have sent methylphenidate  18 mg, 54 mg and 5 mg as needed to [facility] as requested.

## 2024-01-25 ENCOUNTER — Ambulatory Visit (INDEPENDENT_AMBULATORY_CARE_PROVIDER_SITE_OTHER): Payer: Self-pay | Admitting: Psychiatry

## 2024-01-25 ENCOUNTER — Encounter: Payer: Self-pay | Admitting: Psychiatry

## 2024-01-25 VITALS — BP 110/82 | HR 78 | Temp 98.9°F | Ht 64.0 in | Wt 156.2 lb

## 2024-01-25 DIAGNOSIS — Z8659 Personal history of other mental and behavioral disorders: Secondary | ICD-10-CM | POA: Diagnosis not present

## 2024-01-25 DIAGNOSIS — F900 Attention-deficit hyperactivity disorder, predominantly inattentive type: Secondary | ICD-10-CM

## 2024-01-25 NOTE — Progress Notes (Signed)
 [facility] MD OP Progress Note  01/25/2024 5:04 PM Marilyn Norris  MRN:  [id number]  Chief Complaint:  Chief Complaint  Patient presents with   Follow-up   ADD   Medication Refill   Discussed the use of AI scribe software for clinical note transcription with the patient, who gave verbal consent to proceed.  History of Present Illness Marilyn Norris is a 43 year old Caucasian female, employed, married, lives in [place], has a history of ADHD inattentive type, depression was evaluated in office today for a follow-up appointment.  She is experiencing issues with her ADHD medication management due to insurance changes. Her current regimen includes taking 54 mg and 18 mg doses to reach a total of 72 mg of Concerta , but only the 18 mg dose received prior authorization. This has led to increased out-of-pocket costs, as she has to take multiple doses of the 18 mg to reach her prescribed amount. She is concerned about the cost and the inconvenience of managing multiple prescriptions.  Her insurance recently changed, which has increased the cost of her medications. She is seeking a more affordable and convenient single-dose option. She has not tried Vyvanse  herself but notes that her daughter is currently on it and has had success without significant side effects.  In terms of her own health, there are no changes in appetite or sleep patterns on her current medication. Initially, there was a decrease in appetite, but her body has since adjusted. No symptoms of depression or anxiety despite recent life changes such as interviewing for a new job.  Denies any suicidality, homicidality or perceptual disturbances.    Visit Diagnosis:    ICD-10-CM   1. Attention deficit hyperactivity disorder (ADHD), predominantly inattentive type  F90.0     2. History of depression  Z86.59       Past Psychiatric History: I have reviewed past psychiatric history from progress note on 06/26/2020.  Past trials of  Adderall, Concerta , Ritalin .  Past Medical History:  Past Medical History:  Diagnosis Date   Adult ADHD    ADD   Dysplastic nevus 04/12/2018   R inf breast - mild   Dysplastic nevus 04/12/2018   L mid to upper back 4.0 cm lat to spine - severe, excision 11/14/2018   Dysplastic nevus 01/11/2019   R med sup pubic area - mild    Past Surgical History:  Procedure Laterality Date   CESAREAN SECTION  07/03/2009   HERNIA REPAIR     TONSILLECTOMY AND ADENOIDECTOMY     21-82 years old     Family Psychiatric History: I have reviewed family psychiatric history from progress note on 06/26/2020.  Family History:  Family History  Problem Relation Age of Onset   Thyroid disease Mother    Cancer Father        Kidney    Thyroid disease Sister    Bipolar disorder Maternal Uncle    ADD / ADHD Daughter    Breast cancer Neg Hx     Social History: I have reviewed social history from progress note on 06/26/2020. Social History   Socioeconomic History   Marital status: Married    Spouse name: Not on file   Number of children: Not on file   Years of education: Not on file   Highest education level: Not on file  Occupational History   Not on file  Tobacco Use   Smoking status: Never   Smokeless tobacco: Never  Vaping Use   Vaping status:  Never Used  Substance and Sexual Activity   Alcohol use: Yes    Comment: SOCIAL   Drug use: No   Sexual activity: Yes    Birth control/protection: I.U.D.  Other Topics Concern   Not on file  Social History Narrative   Not on file   Social Drivers of Health   Financial Resource Strain: Low Risk  (12/20/2022)   Received from [facility], [facility]   Overall Financial Resource Strain (CARDIA)    Difficulty of Paying Living Expenses: Not hard at all  Food Insecurity: No Food Insecurity (12/20/2022)   Received from [facility], [facility]   Hunger Vital Sign    Worried About  Running Out of Food in the Last Year: Never true    Ran Out of Food in the Last Year: Never true  Transportation Needs: No Transportation Needs (12/20/2022)   Received from [facility], [facility]   [facility] - Transportation    In the past 12 months, has lack of transportation kept you from medical appointments or from getting medications?: No    Lack of Transportation (Non-Medical): No  Physical Activity: Insufficiently Active (12/20/2022)   Received from [facility], [facility]   Exercise Vital Sign    Days of Exercise per Week: 2 days    Minutes of Exercise per Session: 20 min  Stress: No Stress Concern Present (12/20/2022)   Received from [facility], [facility]   [employer] of Occupational Health - Occupational Stress Questionnaire    Feeling of Stress : Not at all  Social Connections: Socially Integrated (12/20/2022)   Received from [facility], [facility]   Social Connection and Isolation Panel [NHANES]    Frequency of Communication with Friends and Family: More than three times a week    Frequency of Social Gatherings with Friends and Family: Once a week    Attends Religious Services: More than 4 times per year    Active Member of Clubs or Organizations: Yes    Attends Engineer, Structural: More than 4 times per year    Marital Status: Married    Allergies:  Allergies  Allergen Reactions   Morphine Itching   Morphine And Codeine Itching    Metabolic Disorder Labs: No results found for: HGBA1C, MPG No results found for: PROLACTIN No results found for: CHOL, TRIG, HDL, CHOLHDL, VLDL, LDLCALC Lab Results  Component Value Date   TSH 1.070 09/12/2017    Therapeutic Level Labs: No results found for: LITHIUM No results found for: VALPROATE No results found for: CBMZ  Current  Medications: Current Outpatient Medications  Medication Sig Dispense Refill   doxycycline  (PERIOSTAT ) 20 MG tablet TAKE 1 TABLET BY MOUTH TWICE A DAY 180 tablet 1   fluconazole  (DIFLUCAN ) 150 MG tablet Take 1 tablet (150 mg total) by mouth once a week. 4 tablet 0   hydrocortisone 2.5 % cream Apply topically.     methylphenidate  (CONCERTA ) 18 MG PO CR tablet Take 1 tablet (18 mg total) by mouth every morning. Take along with 54 mg daily 90 tablet 0   methylphenidate  (RITALIN ) 5 MG tablet Take 1 tablet (5 mg total) by mouth daily after lunch. 30 tablet 0   methylphenidate  (RITALIN ) 5 MG tablet Take 1 tablet (5 mg total) by mouth daily after lunch. 90 tablet 0   methylphenidate  (RITALIN ) 5 MG  tablet Take 1 tablet (5 mg total) by mouth daily after lunch. 30 tablet 0   methylphenidate  54 MG PO CR tablet Take 1 tablet (54 mg total) by mouth every morning. Take along with 18 mg daily 90 tablet 0   mometasone  (ELOCON ) 0.1 % cream Apply 1 Application topically daily as needed (Rash). 45 g 3   Levonorgestrel  (KYLEENA ) 19.5 MG IUD 1 Device by Intrauterine route once for 1 dose. 1 Intra Uterine Device 0   No current facility-administered medications for this visit.     Musculoskeletal: Strength & Muscle Tone: within normal limits Gait & Station: normal Patient leans: N/A  Psychiatric Specialty Exam: Review of Systems  Psychiatric/Behavioral: Negative.      Blood pressure 110/82, pulse 78, temperature 98.9 F (37.2 C), temperature source Temporal, height 5' 4 (1.626 m), weight 156 lb 3.2 oz (70.9 kg), SpO2 96%.Body mass index is 26.81 kg/m.  General Appearance: Fairly Groomed  Eye Contact:  Fair  Speech:  Normal Rate  Volume:  Normal  Mood:  Euthymic  Affect:  Appropriate  Thought Process:  Goal Directed and Descriptions of Associations: Intact  Orientation:  Full (Time, Place, and Person)  Thought Content: Logical   Suicidal Thoughts:  No  Homicidal Thoughts:  No  Memory:  Immediate;    Fair Recent;   Fair Remote;   Fair  Judgement:  Fair  Insight:  Fair  Psychomotor Activity:  Normal  Concentration:  Concentration: Fair and Attention Span: Fair  Recall:  [employer] of Knowledge: Fair  Language: Fair  Akathisia:  No  Handed:  Right  AIMS (if indicated): not done  Assets:  Communication Skills Desire for Improvement Social Support Talents/Skills Transportation Vocational/Educational  ADL's:  Intact  Cognition: WNL  Sleep:  Fair   Screenings: GAD-7    Garment/textile Technologist Visit from 01/25/2024 in [facility]  Total GAD-7 Score 2      PHQ2-9    Flowsheet Row Office Visit from 01/25/2024 in [facility] Office Visit from 07/26/2023 in [facility] Video Visit from 10/21/2022 in [facility] Video Visit from 05/13/2022 in [facility] Office Visit from 02/17/2022 in [facility]  PHQ-2 Total Score 0 0 0 0 0  PHQ-9 Total Score -- -- -- -- 1      Flowsheet Row Office Visit from 01/25/2024 in [facility] UC from 10/13/2023 in [facility] Visit from 07/26/2023 in [facility]  C-SSRS RISK CATEGORY No Risk No Risk No Risk        Assessment and Plan;Marilyn Norris is a 43 year old Caucasian female, employed, has a history of ADHD presented for a follow-up appointment, discussed assessment and plan as noted below.  Assessment & Plan Attention-deficit hyperactivity disorder (ADHD)-stable ADHD is managed with Concerta  and low dose Ritalin  ( which she uses as needed), but insurance issues have increased out-of-pocket costs. She is concerned about the cost and the need for multiple doses. A switch to Vyvanse , a potentially more  cost-effective single-dose medication, was discussed. Plan to initiate Vyvanse  at 20 mg due to it being a new medication, with potential titration based on response and side effects. Advised to continue current medication until school ends to avoid disruption during a busy period. - Continue Concerta  72 mg daily in the  morning - Continue Ritalin  5 mg daily in the afternoon - Schedule a video visit in June to discuss medication transition. - Schedule an in-person follow-up appointment in July after starting Vyvanse . - Check with the pharmacy regarding prior authorization for current medication ( staff to contact). - Reviewed [place] PMP AWARxE   Follow-up in clinic in 1 month or sooner if needed.   Consent: Patient/Guardian gives verbal consent for treatment and assignment of benefits for services provided during this visit. Patient/Guardian expressed understanding and agreed to proceed.   This note was generated in part or whole with voice recognition software. Voice recognition is usually quite accurate but there are transcription errors that can and very often do occur. I apologize for any typographical errors that were not detected and corrected.    Gabriella Guile, MD 01/26/2024, 8:09 AM

## 2024-01-31 ENCOUNTER — Ambulatory Visit: Admitting: Dermatology

## 2024-03-09 ENCOUNTER — Encounter: Payer: Self-pay | Admitting: Psychiatry

## 2024-03-09 ENCOUNTER — Telehealth: Admitting: Psychiatry

## 2024-03-09 DIAGNOSIS — F900 Attention-deficit hyperactivity disorder, predominantly inattentive type: Secondary | ICD-10-CM | POA: Diagnosis not present

## 2024-03-09 DIAGNOSIS — Z8659 Personal history of other mental and behavioral disorders: Secondary | ICD-10-CM | POA: Diagnosis not present

## 2024-03-09 MED ORDER — LISDEXAMFETAMINE DIMESYLATE 20 MG PO CAPS: 20.0000 mg | ORAL_CAPSULE | Freq: Every morning | ORAL | 0 refills | Status: DC

## 2024-03-09 NOTE — Progress Notes (Signed)
 Virtual Visit via Video Note  I connected with Sari Nickola Laurence on 03/09/24 at 11:20 AM EDT by a video enabled telemedicine application and verified that I am speaking with the correct person using two identifiers.  Location Provider Location : [facility] [place]  Participants: Patient , Provider    I discussed the limitations of evaluation and management by telemedicine and the availability of in person appointments. The patient expressed understanding and agreed to proceed.   I discussed the assessment and treatment plan with the patient. The patient was provided an opportunity to ask questions and all were answered. The patient agreed with the plan and demonstrated an understanding of the instructions.   The patient was advised to call back or seek an in-person evaluation if the symptoms worsen or if the condition fails to improve as anticipated.   BH MD OP Progress Note  03/09/2024 11:38 AM Cayci Mcnabb  MRN:  [id number]  Chief Complaint:  Chief Complaint  Patient presents with   Follow-up   Anxiety   ADD   Medication Refill   Discussed the use of AI scribe software for clinical note transcription with the patient, who gave verbal consent to proceed.  History of Present Illness Allecia Bells is a 43 year old Caucasian female, employed, married, lives in [place], has a history of ADHD inattentive type, depression was evaluated by telemedicine today.    She has been managing ADHD symptoms and was previously on Concerta  and Ritalin , which she found to be ineffective. She has also tried Mydayis , which was approved for three months before being discontinued. She reports that taking breaks from Concerta  and then resuming it makes it more effective.She is considering trying Vyvanse , as it is a long-acting medication, which she has struggled to find.  Currently, she is not experiencing any mood changes, sleep disturbances, or depressive  symptoms. She reports sleeping well and enjoying her vacation at [facility]. She plans to engage in more schoolwork over the summer as she is switching grade levels and needs to learn a new curriculum.  She denies any suicidality, homicidality or perceptual disturbances.    Visit Diagnosis:    ICD-10-CM   1. Attention deficit hyperactivity disorder (ADHD), predominantly inattentive type  F90.0 lisdexamfetamine (VYVANSE ) 20 MG capsule    2. History of depression  Z86.59       Past Psychiatric History: I have reviewed past psychiatric history from progress note on 06/26/2020.  Past trials of Adderall, Concerta ,Mydayis , Ritalin .  Past Medical History:  Past Medical History:  Diagnosis Date   Adult ADHD    ADD   Dysplastic nevus 04/12/2018   R inf breast - mild   Dysplastic nevus 04/12/2018   L mid to upper back 4.0 cm lat to spine - severe, excision 11/14/2018   Dysplastic nevus 01/11/2019   R med sup pubic area - mild    Past Surgical History:  Procedure Laterality Date   CESAREAN SECTION  07/03/2009   HERNIA REPAIR     TONSILLECTOMY AND ADENOIDECTOMY     14-48 years old     Family Psychiatric History: I have reviewed family psychiatric history from progress note on 06/26/2020.  Family History:  Family History  Problem Relation Age of Onset   Thyroid disease Mother    Cancer Father        Kidney    Thyroid disease Sister    Bipolar disorder Maternal Uncle    ADD / ADHD  Daughter    Breast cancer Neg Hx     Social History: I have reviewed social history from progress note on 06/26/2020. Social History   Socioeconomic History   Marital status: Married    Spouse name: Not on file   Number of children: Not on file   Years of education: Not on file   Highest education level: Not on file  Occupational History   Not on file  Tobacco Use   Smoking status: Never   Smokeless tobacco: Never  Vaping Use   Vaping status: Never Used  Substance and Sexual Activity    Alcohol use: Yes    Comment: SOCIAL   Drug use: No   Sexual activity: Yes    Birth control/protection: I.U.D.  Other Topics Concern   Not on file  Social History Narrative   Not on file   Social Drivers of Health   Financial Resource Strain: Low Risk  (12/20/2022)   Received from [facility]   Overall Financial Resource Strain (CARDIA)    Difficulty of Paying Living Expenses: Not hard at all  Food Insecurity: No Food Insecurity (12/20/2022)   Received from [facility]   Hunger Vital Sign    Within the past 12 months, you worried that your food would run out before you got the money to buy more.: Never true    Within the past 12 months, the food you bought just didn't last and you didn't have money to get more.: Never true  Transportation Needs: No Transportation Needs (12/20/2022)   Received from [facility] - Transportation    In the past 12 months, has lack of transportation kept you from medical appointments or from getting medications?: No    Lack of Transportation (Non-Medical): No  Physical Activity: Insufficiently Active (12/20/2022)   Received from [facility]   Exercise Vital Sign    On average, how many days per week do you engage in moderate to strenuous exercise (like a brisk walk)?: 2 days    On average, how many minutes do you engage in exercise at this level?: 20 min  Stress: No Stress Concern Present (12/20/2022)   Received from [facility] Occupational [facility] - Occupational Stress Questionnaire    Feeling of Stress : Not at all  Social Connections: Socially Integrated (12/20/2022)   Received from [facility]   Social Connection and Isolation Panel    In a typical week, how many times do you talk on the phone with family, friends, or neighbors?: More than three times a week    How often do you get together with friends or relatives?:  Once a week    How often do you attend church or religious services?: More than 4 times per year    Do you belong to any clubs or organizations such as church groups, unions, fraternal or athletic groups, or school groups?: Yes    How often do you attend meetings of the clubs or organizations you belong to?: More than 4 times per year    Are you married, widowed, divorced, separated, never married, or living with a partner?: Married    Allergies:  Allergies  Allergen Reactions   Morphine Itching   Morphine And Codeine Itching    Metabolic Disorder Labs: No results found for: HGBA1C, MPG No results found for: PROLACTIN No results found for: CHOL, TRIG, HDL, CHOLHDL, VLDL, LDLCALC  Lab Results  Component Value Date   TSH 1.070 09/12/2017    Therapeutic Level Labs: No results found for: LITHIUM No results found for: VALPROATE No results found for: CBMZ  Current Medications: Current Outpatient Medications  Medication Sig Dispense Refill   lisdexamfetamine (VYVANSE ) 20 MG capsule Take 1 capsule (20 mg total) by mouth in the morning for 15 days. 15 capsule 0   doxycycline  (PERIOSTAT ) 20 MG tablet TAKE 1 TABLET BY MOUTH TWICE A DAY 180 tablet 1   fluconazole  (DIFLUCAN ) 150 MG tablet Take 1 tablet (150 mg total) by mouth once a week. 4 tablet 0   hydrocortisone 2.5 % cream Apply topically.     Levonorgestrel  (KYLEENA ) 19.5 MG IUD 1 Device by Intrauterine route once for 1 dose. 1 Intra Uterine Device 0   mometasone  (ELOCON ) 0.1 % cream Apply 1 Application topically daily as needed (Rash). 45 g 3   No current facility-administered medications for this visit.     Musculoskeletal: Strength & Muscle Tone: UTA Gait & Station: Seated Patient leans: N/A  Psychiatric Specialty Exam: Review of Systems  Psychiatric/Behavioral:  Positive for decreased concentration.     There were no vitals taken for this visit.There is no height or weight on file to calculate  BMI.  General Appearance: Casual  Eye Contact:  Fair  Speech:  Normal Rate  Volume:  Normal  Mood:  Euthymic  Affect:  Congruent  Thought Process:  Goal Directed and Descriptions of Associations: Intact  Orientation:  Full (Time, Place, and Person)  Thought Content: Logical   Suicidal Thoughts:  No  Homicidal Thoughts:  No  Memory:  Immediate;   Fair Recent;   Fair Remote;   Fair  Judgement:  Fair  Insight:  Fair  Psychomotor Activity:  Normal  Concentration:  Concentration: Fair and Attention Span: Fair  Recall:  [employer] of Knowledge: Fair  Language: Fair  Akathisia:  No  Handed:  Right  AIMS (if indicated): not done  Assets:  Communication Skills Desire for Improvement Housing Social Support  ADL's:  Intact  Cognition: WNL  Sleep:  Fair   Screenings: GAD-7    Flowsheet Row Office Visit from 01/25/2024 in [facility]  Total GAD-7 Score 2   PHQ2-9    Flowsheet Row Office Visit from 01/25/2024 in [facility] Office Visit from 07/26/2023 in [facility] Video Visit from 10/21/2022 in [facility] Video Visit from 05/13/2022 in [facility] Office Visit from 02/17/2022 in [facility]  PHQ-2 Total Score 0 0 0 0 0  PHQ-9 Total Score -- -- -- -- 1   Flowsheet Row Video Visit from 03/09/2024 in [facility] Office Visit from 01/25/2024 in [facility] UC from 10/13/2023 in [facility]   C-SSRS RISK CATEGORY No Risk No Risk No Risk     Assessment and Plan: Moselle Rister is a 43 year old Caucasian female, employed, has a history of ADHD, was evaluated by telemedicine today.  Discussed assessment and plan as noted below.  Attention deficit  hyperactivity disorder-unstable She is currently not satisfied with current medication regimen which includes Concerta  and low dose Ritalin .  She previously tried a long-acting medication, Mydayis  however could not afford it although she liked it.  Interested in trial of Vyvanse . Start Vyvanse  20 mg p.o.  daily. Provided 15-day supply. Patient to be reevaluated in 2 weeks from now. Consider dosage increase gradually. Provided medication education, discussed benefits, risks, interactions. Reviewed [place] PMP AWARxE  Follow-up Follow-up in clinic in 2 to 3 weeks or sooner if needed.   Consent: Patient/Guardian gives verbal consent for treatment and assignment of benefits for services provided during this visit. Patient/Guardian expressed understanding and agreed to proceed.   This note was generated in part or whole with voice recognition software. Voice recognition is usually quite accurate but there are transcription errors that can and very often do occur. I apologize for any typographical errors that were not detected and corrected.  I have spent atleast 30 minutes face to face with patient today by video which includes the time spent for preparing to see the patient ( e.g., review of test, records ),ordering medications  ,psychoeducation as well as documenting clinical information in electronic health record.   Vaani Morren, MD 03/10/2024, 8:29 AM

## 2024-03-09 NOTE — Patient Instructions (Signed)
 Lisdexamfetamine Capsules What is this medication? LISDEXAMFETAMINE (lis DEX am fet a meen) treats attention-deficit hyperactivity disorder (ADHD). It may also be used to treat binge eating disorder. It works by reducing hyperactivity and impulsive behaviors. It belongs to a group of medications called stimulants. This medicine may be used for other purposes; ask your health care provider or pharmacist if you have questions. COMMON BRAND NAME(S): Vyvanse What should I tell my care team before I take this medication? They need to know if you have any of these conditions: Anxiety or panic attacks Circulation problems in fingers and toes Glaucoma Hardening or blockages of the arteries or heart blood vessels Heart disease or a heart defect High blood pressure History of stroke Kidney disease Liver disease Mental health condition Seizures Substance use disorder Suicidal thoughts, plans, or attempt by you or a family member Thyroid disease Tourette's syndrome An unusual or allergic reaction to lisdexamfetamine, other medications, foods, dyes, or preservatives Pregnant or trying to get pregnant Breast-feeding How should I use this medication? Take this medication by mouth. Follow the directions on the prescription label. Swallow the capsules with a drink of water. You may open capsule and add to a glass of water, then drink right away. Take your doses at regular intervals. Do not take your medication more often than directed. Do not suddenly stop your medication. You must gradually reduce the dose, or you may feel withdrawal effects. Ask your care team for advice. A special MedGuide will be given to you by the pharmacist with each prescription and refill. Be sure to read this information carefully each time. Talk to your care team about the use of this medication in children. While this medication may be prescribed for children as young as 34 years of age for selected conditions, precautions do  apply. Overdosage: If you think you have taken too much of this medicine contact a poison control center or emergency room at once. NOTE: This medicine is only for you. Do not share this medicine with others. What if I miss a dose? If you miss a dose, take it as soon as you can. If it is almost time for your next dose, take only that dose. Do not take double or extra doses. What may interact with this medication? Do not take this medication with any of the following: Linezolid MAOIs, such as Marplan, Nardil, and Parnate Methylene blue This medication may also interact with the following: Acetazolamide Alcohol Ascorbic acid Certain medications for depression, anxiety, or other mental health conditions Certain medications for migraines, such as sumatriptan Guanethidine Opioids Reserpine Sodium bicarbonate St. John's wort Thiazide diuretics, such as chlorothiazide Tryptophan This list may not describe all possible interactions. Give your health care provider a list of all the medicines, herbs, non-prescription drugs, or dietary supplements you use. Also tell them if you smoke, drink alcohol, or use illegal drugs. Some items may interact with your medicine. What should I watch for while using this medication? Visit your care team for regular checks on your progress. Tell your care team if your symptoms do not start to get better or if they get worse. This medication requires a new prescription from your care team every time it is filled at the pharmacy. This medication can be abused and cause your brain and body to depend on it after high doses or long term use. Your care team will assess your risk and monitor you closely during treatment. Long term use of this medication may cause your brain and  body to depend on it. You may be able to take breaks from this medication during weekends, holidays, or summer vacations. Talk to your care team about what works for you. If your care team wants you  to stop this medication permanently, the dose may be slowly lowered over time to reduce the risk of side effects. Tell your care team if this medication loses its effects, or if you feel you need to take more than the prescribed amount. Do not change your dose without talking to your care team. Do not take this medication close to bedtime. It may prevent you from sleeping. Loss of appetite is common when starting this medication. Eating small, frequent meals or snacks can help. Talk to your care team if appetite loss persists. Children should have height and weight checked often while taking this medication. Tell your care team right away if you notice unexplained wounds on your fingers and toes while taking this medication. You should also tell your care team if you experience numbness or pain, changes in the skin color, or sensitivity to temperature in your fingers or toes. Contact your care team right away if you have an erection that lasts longer than 4 hours or if it becomes painful. This may be a sign of a serious problem and must be treated right away to prevent permanent damage. What side effects may I notice from receiving this medication? Side effects that you should report to your care team as soon as possible: Allergic reactions--skin rash, itching, hives, swelling of the face, lips, tongue, or throat Heart attack--pain or tightness in the chest, shoulders, arms, or jaw, nausea, shortness of breath, cold or clammy skin, feeling faint or lightheaded Heart rhythm changes--fast or irregular heartbeat, dizziness, feeling faint or lightheaded, chest pain, trouble breathing Increase in blood pressure Irritability, confusion, fast or irregular heartbeat, muscle stiffness, twitching muscles, sweating, high fever, seizure, chills, vomiting, diarrhea, which may be signs of serotonin syndrome Mood and behavior changes--anxiety, nervousness, confusion, hallucinations, irritability, hostility, thoughts  of suicide or self-harm, worsening mood, feelings of depression Prolonged or painful erection Raynaud syndrome--cool, numb, or painful fingers or toes that may change color from pale, to blue, to red Seizures Stroke--sudden numbness or weakness of the face, arm, or leg, trouble speaking, confusion, trouble walking, loss of balance or coordination, dizziness, severe headache, change in vision Side effects that usually do not require medical attention (report these to your care team if they continue or are bothersome): Dry mouth Headache Loss of appetite with weight loss Nausea Stomach pain Trouble sleeping This list may not describe all possible side effects. Call your doctor for medical advice about side effects. You may report side effects to FDA at 1-800-FDA-1088. Where should I keep my medication? Keep out of the reach of children and pets. This medication can be abused. Keep it in a safe place to protect it from theft. Do not share it with anyone. It is only for you. Selling or giving away this medication is dangerous and against the law. Store at room temperature between 15 and 30 degrees C (59 and 86 degrees F). Protect from light. Keep container tightly closed. Get rid of any unused medication after the expiration date. This medication may cause harm and death if it is taken by other adults, children, or pets. It is important to get rid of the medication as soon as you no longer need it or it is expired. You can do this in two ways: Take the medication  to a medication take-back program. Check with your pharmacy or law enforcement to find a location. If you cannot return the medication, check the label or package insert to see if the medication should be thrown out in the garbage or flushed down the toilet. If you are not sure, ask your care team. If it is safe to put it in the trash, take the medication out of the container. Mix the medication with cat litter, dirt, coffee grounds, or other  unwanted substance. Seal the mixture in a bag or container. Put it in the trash. NOTE: This sheet is a summary. It may not cover all possible information. If you have questions about this medicine, talk to your doctor, pharmacist, or health care provider.  2024 Elsevier/Gold Standard (2023-08-19 00:00:00)

## 2024-03-12 ENCOUNTER — Telehealth: Payer: Self-pay

## 2024-03-12 NOTE — Telephone Encounter (Signed)
 pharmacy was faxed and confirmed the approval notice for the lisdexamfetamine 20mg 

## 2024-03-12 NOTE — Telephone Encounter (Signed)
 went online to [email or web address] and a prior auth was needed for the lisdexamfetamin dimesylate 20mg  capsule. prior shara was submitted and is pending.

## 2024-03-12 NOTE — Telephone Encounter (Signed)
 prior auth had been approved

## 2024-03-19 ENCOUNTER — Telehealth: Payer: Self-pay

## 2024-03-19 NOTE — Telephone Encounter (Signed)
 received fax that lisdexamfetamine was approved from 03-12-24 to 03-13-27

## 2024-03-28 ENCOUNTER — Ambulatory Visit (INDEPENDENT_AMBULATORY_CARE_PROVIDER_SITE_OTHER): Admitting: Psychiatry

## 2024-03-28 ENCOUNTER — Encounter: Payer: Self-pay | Admitting: Psychiatry

## 2024-03-28 VITALS — BP 118/78 | HR 82 | Temp 98.4°F | Ht 64.0 in | Wt 157.2 lb

## 2024-03-28 DIAGNOSIS — Z8659 Personal history of other mental and behavioral disorders: Secondary | ICD-10-CM

## 2024-03-28 DIAGNOSIS — F900 Attention-deficit hyperactivity disorder, predominantly inattentive type: Secondary | ICD-10-CM

## 2024-03-28 MED ORDER — LISDEXAMFETAMINE DIMESYLATE 40 MG PO CAPS: 40.0000 mg | ORAL_CAPSULE | ORAL | 0 refills | Status: DC

## 2024-03-28 NOTE — Progress Notes (Unsigned)
 [facility] MD OP Progress Note  03/28/2024 11:16 AM Marilyn Norris  MRN:  [id number]  Chief Complaint:  Chief Complaint  Patient presents with   Follow-up   ADD   Medication Refill   Discussed the use of AI scribe software for clinical note transcription with the patient, who gave verbal consent to proceed.  History of Present Illness Marilyn Norris is a 43 year old Caucasian female, employed, married, lives in [place], has a history of ADHD inattentive type, depression was evaluated in office today for a follow-up appointment.  She has been taking Vyvanse  20 mg daily for over a week but delayed starting the medication due to a busy schedule. She feels the current dose is ineffective and does not notice much difference in her symptoms. She has not experienced any side effects, including changes in appetite.  Her daily routine includes going to the gym, which provides her with energy, but she experiences a crash later in the day. 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 She is moving to a school closer to her home, which will allow her youngest daughter to meet peers she will attend middle school with.  No significant symptoms of depression or anxiety are present, although she acknowledges some task avoidance, which she attributes to the lack of structure during the summer. No changes in appetite or other side effects from the medication.  She denies any suicidality, homicidality or perceptual disturbances.  Visit Diagnosis:    ICD-10-CM   1. Attention deficit hyperactivity disorder (ADHD), predominantly inattentive type  F90.0 lisdexamfetamine (VYVANSE ) 40 MG capsule    2. History of depression  Z86.59       Past Psychiatric History: I have reviewed past psychiatric history from progress note on 06/26/2020.  Past trials of Adderall, Concerta , Mydayis , Ritalin .  Past Medical History:  Past Medical History:   Diagnosis Date   Adult ADHD    ADD   Dysplastic nevus 04/12/2018   R inf breast - mild   Dysplastic nevus 04/12/2018   L mid to upper back 4.0 cm lat to spine - severe, excision 11/14/2018   Dysplastic nevus 01/11/2019   R med sup pubic area - mild    Past Surgical History:  Procedure Laterality Date   CESAREAN SECTION  07/03/2009   HERNIA REPAIR     TONSILLECTOMY AND ADENOIDECTOMY     52-11 years old     Family Psychiatric History: I have reviewed family psychiatric history from progress note on 06/26/2020.  Family History:  Family History  Problem Relation Age of Onset   Thyroid disease Mother    Cancer Father        Kidney    Thyroid disease Sister    Bipolar disorder Maternal Uncle    ADD / ADHD Daughter    Breast cancer Neg Hx     Social History: I have reviewed social history from progress note on 06/26/2020. Social History   Socioeconomic History   Marital status: Married    Spouse name: Not on file   Number of children: Not on file   Years of education: Not on file   Highest education level: Not on file  Occupational History   Not on file  Tobacco Use   Smoking status: Never   Smokeless tobacco: Never  Vaping Use   Vaping status: Never Used  Substance and Sexual Activity   Alcohol use: Yes    Comment:  SOCIAL   Drug use: No   Sexual activity: Yes    Birth control/protection: I.U.D.  Other Topics Concern   Not on file  Social History Narrative   Not on file   Social Drivers of Health   Financial Resource Strain: Low Risk  (12/20/2022)   Received from [facility]   Overall Financial Resource Strain (CARDIA)    Difficulty of Paying Living Expenses: Not hard at all  Food Insecurity: No Food Insecurity (12/20/2022)   Received from [facility]   Hunger Vital Sign    Within the past 12 months, you worried that your food would run out before you got the money to buy more.: Never true    Within the past 12 months, the  food you bought just didn't last and you didn't have money to get more.: Never true  Transportation Needs: No Transportation Needs (12/20/2022)   Received from [facility] - Transportation    In the past 12 months, has lack of transportation kept you from medical appointments or from getting medications?: No    Lack of Transportation (Non-Medical): No  Physical Activity: Insufficiently Active (12/20/2022)   Received from [facility]   Exercise Vital Sign    On average, how many days per week do you engage in moderate to strenuous exercise (like a brisk walk)?: 2 days    On average, how many minutes do you engage in exercise at this level?: 20 min  Stress: No Stress Concern Present (12/20/2022)   Received from [facility] - Occupational Stress Questionnaire    Feeling of Stress : Not at all  Social Connections: Socially Integrated (12/20/2022)   Received from [facility]   Social Connection and Isolation Panel    In a typical week, how many times do you talk on the phone with family, friends, or neighbors?: More than three times a week    How often do you get together with friends or relatives?: Once a week    How often do you attend church or religious services?: More than 4 times per year    Do you belong to any clubs or organizations such as church groups, unions, fraternal or athletic groups, or school groups?: Yes    How often do you attend meetings of the clubs or organizations you belong to?: More than 4 times per year    Are you married, widowed, divorced, separated, never married, or living with a partner?: Married    Allergies:  Allergies  Allergen Reactions   Morphine Itching   Morphine And Codeine Itching    Metabolic Disorder Labs: No results found for: HGBA1C, MPG No results found for: PROLACTIN No results found for: CHOL, TRIG, HDL,  CHOLHDL, VLDL, LDLCALC Lab Results  Component Value Date   TSH 1.070 09/12/2017    Therapeutic Level Labs: No results found for: LITHIUM No results found for: VALPROATE No results found for: CBMZ  Current Medications: Current Outpatient Medications  Medication Sig Dispense Refill   doxycycline  (PERIOSTAT ) 20 MG tablet TAKE 1 TABLET BY MOUTH TWICE A DAY 180 tablet 1   fluconazole  (DIFLUCAN ) 150 MG tablet Take 1 tablet (150 mg total) by mouth once a week. 4 tablet 0   hydrocortisone 2.5 % cream Apply topically.     Levonorgestrel  (KYLEENA ) 19.5 MG IUD 1 Device by Intrauterine route once for 1 dose. 1 Intra Uterine  Device 0   lisdexamfetamine (VYVANSE ) 40 MG capsule Take 1 capsule (40 mg total) by mouth every morning. 30 capsule 0   mometasone  (ELOCON ) 0.1 % cream Apply 1 Application topically daily as needed (Rash). 45 g 3   No current facility-administered medications for this visit.     Musculoskeletal: Strength & Muscle Tone: within normal limits Gait & Station: normal Patient leans: N/A  Psychiatric Specialty Exam: Review of Systems  Psychiatric/Behavioral:  Positive for decreased concentration.     Blood pressure 118/78, pulse 82, temperature 98.4 F (36.9 C), temperature source Temporal, height 5' 4 (1.626 m), weight 157 lb 3.2 oz (71.3 kg), SpO2 97%.Body mass index is 26.98 kg/m.  General Appearance: Fairly Groomed  Eye Contact:  Fair  Speech:  Clear and Coherent  Volume:  Normal  Mood:  Euthymic  Affect:  Congruent  Thought Process:  Goal Directed and Descriptions of Associations: Intact  Orientation:  Full (Time, Place, and Person)  Thought Content: Logical   Suicidal Thoughts:  No  Homicidal Thoughts:  No  Memory:  Immediate;   Fair Recent;   Fair Remote;   Fair  Judgement:  Fair  Insight:  Fair  Psychomotor Activity:  Normal  Concentration:  Concentration: Fair and Attention Span: Fair  Recall:  [employer] of Knowledge: Fair  Language:  Fair  Akathisia:  No  Handed:  Right  AIMS (if indicated): not done  Assets:  Communication Skills Desire for Improvement Social Support Talents/Skills Transportation  ADL's:  Intact  Cognition: WNL  Sleep:  Fair   Screenings: GAD-7    Garment/textile technologist Visit from 01/25/2024 in [facility]  Total GAD-7 Score 2   PHQ2-9    Flowsheet Row Office Visit from 03/28/2024 in [facility] Office Visit from 01/25/2024 in [facility] Office Visit from 07/26/2023 in [facility] Video Visit from 10/21/2022 in [facility] Video Visit from 05/13/2022 in [facility]  PHQ-2 Total Score 0 0 0 0 0   Flowsheet Row Office Visit from 03/28/2024 in [facility] Video Visit from 03/09/2024 in [facility] Office Visit from 01/25/2024 in [facility]  C-SSRS RISK CATEGORY No Risk No Risk No Risk     Assessment and Plan: Symphani Eckstrom is a 43 year old Caucasian female, employed, has a history of ADHD was evaluated in office today.  Discussed assessment and plan as noted below.  Attention deficit hyperactivity disorder-unstable Currently on Vyvanse  20 mg although tolerating it well has not noticed much effect when it comes to her focus, motivation.  Interested in dosage increase. Increase Vyvanse  to 40 mg daily Provided 30-day supply. Reviewed [place] PMP AWARxE  Follow-up Follow-up in clinic in 3 weeks or sooner if needed.   Consent: Patient/Guardian gives verbal consent for treatment and assignment of benefits for services provided during this visit. Patient/Guardian expressed understanding and agreed to proceed.   This note was generated in part or  whole with voice recognition software. Voice recognition is usually quite accurate but there are transcription errors that can and very often do occur. I apologize for any typographical errors that were not detected and corrected.    Marilyn Dearing, MD 03/29/2024, 9:10 AM

## 2024-03-30 ENCOUNTER — Telehealth: Payer: Self-pay

## 2024-03-30 NOTE — Telephone Encounter (Signed)
 left message that prior auth was approved from 03-30-24 to 03-31-27

## 2024-03-30 NOTE — Telephone Encounter (Signed)
 received notice that a prior auth was needed for the lisdexamfetamine dimesylate .

## 2024-03-30 NOTE — Telephone Encounter (Signed)
 received fax that a prior auth was approved from 03-30-24 to 03-31-27.

## 2024-04-10 ENCOUNTER — Ambulatory Visit: Admitting: Dermatology

## 2024-04-10 DIAGNOSIS — L810 Postinflammatory hyperpigmentation: Secondary | ICD-10-CM | POA: Diagnosis not present

## 2024-04-10 DIAGNOSIS — L820 Inflamed seborrheic keratosis: Secondary | ICD-10-CM

## 2024-04-10 DIAGNOSIS — L821 Other seborrheic keratosis: Secondary | ICD-10-CM | POA: Diagnosis not present

## 2024-04-10 DIAGNOSIS — Z79899 Other long term (current) drug therapy: Secondary | ICD-10-CM

## 2024-04-10 DIAGNOSIS — L819 Disorder of pigmentation, unspecified: Secondary | ICD-10-CM

## 2024-04-10 DIAGNOSIS — Z7189 Other specified counseling: Secondary | ICD-10-CM

## 2024-04-10 NOTE — Progress Notes (Unsigned)
   Follow-Up Visit   Subjective  Marilyn Norris is a 43 y.o. female who presents for the following: Check a spot on her left ankle that started as a ring worm ~ 7 months ago, she was treated at Urgent care with a oral antifungal and topical antifungal, now area still pink, no side effects  The patient has spots, moles and lesions to be evaluated, some may be new or changing and the patient may have concern these could be cancer.   The following portions of the chart were reviewed this encounter and updated as appropriate: medications, allergies, medical history  Review of Systems:  No other skin or systemic complaints except as noted in HPI or Assessment and Plan.  Objective  Well appearing patient in no apparent distress; mood and affect are within normal limits.  A focused examination was performed of the following areas: Left leg, right leg, right arm, left arm, neck, face   Relevant exam findings are noted in the Assessment and Plan.  arms, legs, neck, face x 27 (27) Stuck-on, waxy, tan-brown papules and plaques -- Discussed benign etiology and prognosis.   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  Post-inflammatory hyperpimentation (PIH)  is a benign condition that comes from having previous inflammation in the skin and will fade with time over months to sometimes years. Recommend daily sun protection including sunscreen SPF 30+ to sun-exposed areas. - Recommend treating any itchy or red areas on the skin quickly to prevent new areas of PIH. Once rash has cleared, treating with prescription medicines such as hydroquinone may help fade dark spots faster.  Treatment Plan: Start otc Amlactin rapid relief  apply to affected skin daily   INFLAMED SEBORRHEIC KERATOSIS (27) arms, legs, neck, face x 27  (27) Symptomatic, irritating, patient would like treated.  Destruction of lesion - arms, legs, neck, face x 27 (27) Complexity: simple   Destruction method: cryotherapy   Informed consent: discussed and consent obtained   Timeout:  patient name, date of birth, surgical site, and procedure verified Lesion destroyed using liquid nitrogen: Yes   Region frozen until ice ball extended beyond lesion: Yes   Outcome: patient tolerated procedure well with no complications   Post-procedure details: wound care instructions given     SEBORRHEIC KERATOSIS - Stuck-on, waxy, tan-brown papules and/or plaques  - Benign-appearing - Discussed benign etiology and prognosis. - Observe - Call for any changes    Return for scheduled appt Aug 01, 2024.  IFay Norris, CMA, am acting as scribe for Alm Rhyme, MD .   Documentation: I have reviewed the above documentation for accuracy and completeness, and I agree with the above.  Alm Rhyme, MD

## 2024-04-10 NOTE — Patient Instructions (Addendum)

## 2024-04-11 ENCOUNTER — Encounter: Payer: Self-pay | Admitting: Dermatology

## 2024-05-01 DIAGNOSIS — F900 Attention-deficit hyperactivity disorder, predominantly inattentive type: Secondary | ICD-10-CM

## 2024-05-01 MED ORDER — LISDEXAMFETAMINE DIMESYLATE 40 MG PO CAPS: 40.0000 mg | ORAL_CAPSULE | ORAL | 0 refills | Status: DC

## 2024-05-10 ENCOUNTER — Telehealth: Admitting: Psychiatry

## 2024-06-01 DIAGNOSIS — F900 Attention-deficit hyperactivity disorder, predominantly inattentive type: Secondary | ICD-10-CM

## 2024-06-01 MED ORDER — LISDEXAMFETAMINE DIMESYLATE 40 MG PO CAPS: 40.0000 mg | ORAL_CAPSULE | ORAL | 0 refills | Status: DC

## 2024-06-18 ENCOUNTER — Telehealth: Admitting: Psychiatry

## 2024-06-18 ENCOUNTER — Encounter: Payer: Self-pay | Admitting: Psychiatry

## 2024-06-18 DIAGNOSIS — F900 Attention-deficit hyperactivity disorder, predominantly inattentive type: Secondary | ICD-10-CM

## 2024-06-18 DIAGNOSIS — Z8659 Personal history of other mental and behavioral disorders: Secondary | ICD-10-CM | POA: Diagnosis not present

## 2024-06-18 MED ORDER — LISDEXAMFETAMINE DIMESYLATE 40 MG PO CAPS: 40.0000 mg | ORAL_CAPSULE | ORAL | 0 refills | Status: DC

## 2024-06-18 NOTE — Progress Notes (Signed)
 Virtual Visit via Video Note  I connected with Marilyn Norris on 06/18/24 at  4:20 PM EDT by a video enabled telemedicine application and verified that I am speaking with the correct person using two identifiers.  Location Provider Location : [facility] Patient Location : Work  Participants: Patient ,  Daughter, Provider    I discussed the limitations of evaluation and management by telemedicine and the availability of in person appointments. The patient expressed understanding and agreed to proceed.    I discussed the assessment and treatment plan with the patient. The patient was provided an opportunity to ask questions and all were answered. The patient agreed with the plan and demonstrated an understanding of the instructions.   The patient was advised to call back or seek an in-person evaluation if the symptoms worsen or if the condition fails to improve as anticipated.  BH MD OP Progress Note  06/18/2024 4:35 PM Marilyn Norris  MRN:  [id number]  Chief Complaint:  Chief Complaint  Patient presents with   Follow-up   ADD   Anxiety   Medication Refill   Discussed the use of AI scribe software for clinical note transcription with the patient, who gave verbal consent to proceed.  History of Present Illness Marilyn Norris is a 43 year old Caucasian female, employed, married, lives in [place], has a history of ADHD inattentive type, depression was evaluated by telemedicine today.  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 She describes a substantial workload and ongoing efforts to adjust, including developing new materials for her team. Although she finds the adjustment phase stressful, she expresses hope that the situation will improve over the next several months.  She currently  takes Vyvanse  40 mg capsules daily. She reports that the medication improves her focus during the day, but she consistently experiences increased appetite and cravings in the late afternoon when the medication effect diminishes, and she often eats snacks during this period. She also describes a tingling and flushing sensation in her face in the evening as the medication wears off, particularly when she picks up her children from school. She denies any cardiac side effects such as palpitations or chest pain. She notes that her sleep remains unaffected and her morning appetite is normal.  She reports current daily caffeine use, including a morning cup of coffee and at least 2 packets of Crystal Light with added caffeine per day to extend focus.  She denies any suicidality, homicidality or perceptual disturbances.   Visit Diagnosis:    ICD-10-CM   1. Attention deficit hyperactivity disorder (ADHD), predominantly inattentive type  F90.0 lisdexamfetamine (VYVANSE ) 40 MG capsule    2. History of depression  Z86.59       Past Psychiatric History: Reviewed past psychiatric history from progress note on 06/26/2020.  Past trials of Adderall, Concerta , Mydayis , Ritalin .  Past Medical History:  Past Medical History:  Diagnosis Date   Adult ADHD    ADD   Dysplastic nevus 04/12/2018   R inf breast - mild   Dysplastic nevus 04/12/2018   L mid to upper back 4.0 cm lat to spine - severe, excision 11/14/2018   Dysplastic nevus 01/11/2019   R med sup pubic area - mild    Past Surgical History:  Procedure Laterality Date   CESAREAN SECTION  07/03/2009   HERNIA REPAIR  TONSILLECTOMY AND ADENOIDECTOMY     51-65 years old     Family Psychiatric History: I have reviewed family psychiatric history from progress note on 06/26/2020.  Family History:  Family History  Problem Relation Age of Onset   Thyroid disease Mother    Cancer Father        Kidney    Thyroid disease Sister    Bipolar disorder  Maternal Uncle    ADD / ADHD Daughter    Breast cancer Neg Hx     Social History: Reviewed social history from progress note on 06/26/2020. Social History   Socioeconomic History   Marital status: Married    Spouse name: Not on file   Number of children: Not on file   Years of education: Not on file   Highest education level: Not on file  Occupational History   Not on file  Tobacco Use   Smoking status: Never   Smokeless tobacco: Never  Vaping Use   Vaping status: Never Used  Substance and Sexual Activity   Alcohol use: Yes    Comment: SOCIAL   Drug use: No   Sexual activity: Yes    Birth control/protection: I.U.D.  Other Topics Concern   Not on file  Social History Narrative   Not on file   Social Drivers of Health   Financial Resource Strain: Low Risk  (12/20/2022)   Received from [facility]   Overall Financial Resource Strain (CARDIA)    Difficulty of Paying Living Expenses: Not hard at all  Food Insecurity: No Food Insecurity (12/20/2022)   Received [facility]   Hunger Vital Sign    Within the past 12 months, you worried that your food would run out before you got the money to buy more.: Never true    Within the past 12 months, the food you bought just didn't last and you didn't have money to get more.: Never true  Transportation Needs: No Transportation Needs (12/20/2022)   Received from [facility] - Transportation    In the past 12 months, has lack of transportation kept you from medical appointments or from getting medications?: No    Lack of Transportation (Non-Medical): No  Physical Activity: Insufficiently Active (12/20/2022)   Received from [facility]   Exercise Vital Sign    On average, how many days per week do you engage in moderate to strenuous exercise (like a brisk walk)?: 2 days    On average, how many minutes do you engage in exercise at this level?: 20 min   Stress: No Stress Concern Present (12/20/2022)   Received from [facility] - Occupational Stress Questionnaire    Feeling of Stress : Not at all  Social Connections: Socially Integrated (12/20/2022)   Received from [facility]   Social Connection and Isolation Panel    In a typical week, how many times do you talk on the phone with family, friends, or neighbors?: More than three times a week    How often do you get together with friends or relatives?: Once a week    How often do you attend church or religious services?: More than 4 times per year    Do you belong to any clubs or organizations such as church groups, unions, fraternal or athletic groups, or school groups?: Yes    How often do you attend meetings of the  clubs or organizations you belong to?: More than 4 times per year    Are you married, widowed, divorced, separated, never married, or living with a partner?: Married    Allergies:  Allergies  Allergen Reactions   Morphine Itching   Morphine And Codeine Itching    Metabolic Disorder Labs: No results found for: HGBA1C, MPG No results found for: PROLACTIN No results found for: CHOL, TRIG, HDL, CHOLHDL, VLDL, LDLCALC Lab Results  Component Value Date   TSH 1.070 09/12/2017    Therapeutic Level Labs: No results found for: LITHIUM No results found for: VALPROATE No results found for: CBMZ  Current Medications: Current Outpatient Medications  Medication Sig Dispense Refill   doxycycline  (PERIOSTAT ) 20 MG tablet TAKE 1 TABLET BY MOUTH TWICE A DAY 180 tablet 1   fluconazole  (DIFLUCAN ) 150 MG tablet Take 1 tablet (150 mg total) by mouth once a week. 4 tablet 0   hydrocortisone 2.5 % cream Apply topically.     Levonorgestrel  (KYLEENA ) 19.5 MG IUD 1 Device by Intrauterine route once for 1 dose. 1 Intra Uterine Device 0   [START ON 06/29/2024] lisdexamfetamine (VYVANSE ) 40  MG capsule Take 1 capsule (40 mg total) by mouth every morning. 90 capsule 0   mometasone  (ELOCON ) 0.1 % cream Apply 1 Application topically daily as needed (Rash). 45 g 3   No current facility-administered medications for this visit.     Musculoskeletal: Strength & Muscle Tone: UTA Gait & Station: Seated Patient leans: N/A  Psychiatric Specialty Exam: Review of Systems  Psychiatric/Behavioral: Negative.      There were no vitals taken for this visit.There is no height or weight on file to calculate BMI.  General Appearance: Casual  Eye Contact:  Fair  Speech:  Clear and Coherent  Volume:  Normal  Mood:  Euthymic  Affect:  Congruent  Thought Process:  Goal Directed and Descriptions of Associations: Intact  Orientation:  Full (Time, Place, and Person)  Thought Content: Logical   Suicidal Thoughts:  No  Homicidal Thoughts:  No  Memory:  Immediate;   Fair Recent;   Fair Remote;   Fair  Judgement:  Fair  Insight:  Fair  Psychomotor Activity:  Normal  Concentration:  Concentration: Fair and Attention Span: Fair  Recall:  [employer] of Knowledge: Fair  Language: Fair  Akathisia:  No  Handed:  Right  AIMS (if indicated): not done  Assets:  Manufacturing systems engineer Desire for Improvement Housing Social Support Transportation  ADL's:  Intact  Cognition: WNL  Sleep:  Fair   Screenings: GAD-7    Garment/textile technologist Visit from 01/25/2024 in [facility]  Total GAD-7 Score 2   PHQ2-9    Flowsheet Row Office Visit from 03/28/2024 in [facility] Office Visit from 01/25/2024 in [facility] Office Visit from 07/26/2023 in [facility] Video Visit from 10/21/2022 in [facility] Video Visit from 05/13/2022 in [facility]  PHQ-2 Total Score 0 0 0 0 0    Flowsheet Row Video Visit from 06/18/2024 in [facility] Office Visit from 03/28/2024 in [facility] Video Visit from 03/09/2024 in [facility]  C-SSRS RISK CATEGORY No Risk No Risk No Risk     Assessment and Plan: Marilyn Norris is a 43 year old Caucasian female, has a history of  ADHD was evaluated by telemedicine today.  1. Attention deficit hyperactivity disorder (ADHD), predominantly inattentive type-improving Currently reports good benefit from the Vyvanse  40 mg although with some side effects like facial flushing at the end of the day and increased appetite as the medication effect wears off at the end of the day.  However it does not bother her and does not last too long, hence  is not interested in any dose adjustment today. Continue Vyvanse  40 mg daily Provided 90-day supply based on request. Reviewed [place]MP AWARxE   2. History of depression Currently denies any symptoms of depression.  Follow-up Follow-up in clinic in 3 months or sooner in person.    Consent: Patient/Guardian gives verbal consent for treatment and assignment of benefits for services provided during this visit. Patient/Guardian expressed understanding and agreed to proceed.   This note was generated in part or whole with voice recognition software. Voice recognition is usually quite accurate but there are transcription errors that can and very often do occur. I apologize for any typographical errors that were not detected and corrected.    Romin Divita, MD 06/19/2024, 8:57 AM

## 2024-07-16 ENCOUNTER — Telehealth: Admitting: Physician Assistant

## 2024-07-16 DIAGNOSIS — H109 Unspecified conjunctivitis: Secondary | ICD-10-CM

## 2024-07-16 DIAGNOSIS — J069 Acute upper respiratory infection, unspecified: Secondary | ICD-10-CM

## 2024-07-16 DIAGNOSIS — B9689 Other specified bacterial agents as the cause of diseases classified elsewhere: Secondary | ICD-10-CM

## 2024-07-16 MED ORDER — MOXIFLOXACIN HCL 0.5 % OP SOLN: 1.0000 [drp] | Freq: Three times a day (TID) | OPHTHALMIC | 0 refills | Status: AC

## 2024-07-16 MED ORDER — AMOXICILLIN-POT CLAVULANATE 875-125 MG PO TABS: 1.0000 | ORAL_TABLET | Freq: Two times a day (BID) | ORAL | 0 refills | Status: AC

## 2024-07-16 NOTE — Progress Notes (Signed)
 Virtual Visit Consent   Rubi Tooley, you are scheduled for a virtual visit with a [facility] provider today. Just as with appointments in the office, your consent must be obtained to participate. Your consent will be active for this visit and any virtual visit you may have with one of our providers in the next 365 days. If you have a MyChart account, a copy of this consent can be sent to you electronically.  As this is a virtual visit, video technology does not allow for your provider to perform a traditional examination. This may limit your provider's ability to fully assess your condition. If your provider identifies any concerns that need to be evaluated in person or the need to arrange testing (such as labs, EKG, etc.), we will make arrangements to do so. Although advances in technology are sophisticated, we cannot ensure that it will always work on either your end or our end. If the connection with a video visit is poor, the visit may have to be switched to a telephone visit. With either a video or telephone visit, we are not always able to ensure that we have a secure connection.  By engaging in this virtual visit, you consent to the provision of healthcare and authorize for your insurance to be billed (if applicable) for the services provided during this visit. Depending on your insurance coverage, you may receive a charge related to this service.  I need to obtain your verbal consent now. Are you willing to proceed with your visit today? Marilyn Norris has provided verbal consent on 07/16/2024 for a virtual visit (video or telephone). Delon [name], PA-C  Date: 07/16/2024 4:41 PM   Virtual Visit via Video Note   I, Delon [name], connected with  Marilyn Norris  ([id number], 11/28/1980) on 07/16/24 at  4:30 PM EDT by a video-enabled telemedicine application and verified that I am speaking with the correct person using two identifiers.  Location: Patient: Virtual  Visit Location Patient: Mobile Provider: Virtual Visit Location Provider: Home Office   I discussed the limitations of evaluation and management by telemedicine and the availability of in person appointments. The patient expressed understanding and agreed to proceed.    History of Present Illness: Marilyn Norris is a 43 y.o. who identifies as a female who was assigned female at birth, and is being seen today for possible pink eye and URI symptoms.  HPI: Conjunctivitis  The current episode started today. The onset was gradual. The problem occurs rarely. The problem has been unchanged. The problem is mild. Nothing relieves the symptoms. Nothing aggravates the symptoms. Associated symptoms include eye itching, congestion, headaches, sore throat (2 days initial), cough, URI (over a week) and eye redness. Pertinent negatives include no fever, no decreased vision, no double vision, no photophobia, no ear discharge, no ear pain, no rhinorrhea, no swollen glands, no eye discharge and no eye pain. Associated symptoms comments: Thicker mucus, hard to cough up; hoarse voice. The eye pain is mild. The right eye is affected. The eye pain is not associated with movement. The eyelid exhibits no abnormality.     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   History of depression 06/26/2020   VBAC, delivered, current hospitalization 04/27/2014   Contraception 04/18/2014   Group B Streptococcus carrier, +RV culture, currently pregnant 04/18/2014   History of successful vaginal birth  after cesarean, currently pregnant 04/18/2014   Other social stressor 04/18/2014   Immunization counseling 04/18/2014    Allergies:  Allergies  Allergen Reactions   Morphine Itching   Morphine And Codeine Itching   Medications:  Current Outpatient Medications:     amoxicillin-clavulanate (AUGMENTIN) 875-125 MG tablet, Take 1 tablet by mouth 2 (two) times daily., Disp: 14 tablet, Rfl: 0   moxifloxacin (VIGAMOX) 0.5 % ophthalmic solution, Place 1 drop into both eyes 3 (three) times daily for 5 days., Disp: 3 mL, Rfl: 0   doxycycline  (PERIOSTAT ) 20 MG tablet, TAKE 1 TABLET BY MOUTH TWICE A DAY, Disp: 180 tablet, Rfl: 1   fluconazole  (DIFLUCAN ) 150 MG tablet, Take 1 tablet (150 mg total) by mouth once a week., Disp: 4 tablet, Rfl: 0   hydrocortisone 2.5 % cream, Apply topically., Disp: , Rfl:    Levonorgestrel  (KYLEENA ) 19.5 MG IUD, 1 Device by Intrauterine route once for 1 dose., Disp: 1 Intra Uterine Device, Rfl: 0   lisdexamfetamine (VYVANSE ) 40 MG capsule, Take 1 capsule (40 mg total) by mouth every morning., Disp: 90 capsule, Rfl: 0   mometasone  (ELOCON ) 0.1 % cream, Apply 1 Application topically daily as needed (Rash)., Disp: 45 g, Rfl: 3  Observations/Objective: Patient is well-developed, well-nourished in no acute distress.  Resting comfortably Head is normocephalic, atraumatic.  No labored breathing.  Speech is clear and coherent with logical content.  Patient is alert and oriented at baseline.    Assessment and Plan: 1. Bacterial upper respiratory infection (Primary) - amoxicillin-clavulanate (AUGMENTIN) 875-125 MG tablet; Take 1 tablet by mouth 2 (two) times daily.  Dispense: 14 tablet; Refill: 0  2. Bacterial conjunctivitis of right eye - moxifloxacin (VIGAMOX) 0.5 % ophthalmic solution; Place 1 drop into both eyes 3 (three) times daily for 5 days.  Dispense: 3 mL; Refill: 0  - Suspect bacterial conjunctivitis and bacterial upper respiratory infection - Augmentin and Vigamox prescribed - Warm compresses - Good hand hygiene - Steam and humidifier - Saline sinus rinses - Can continue Zinc, Vit C - Seek in person evaluation if symptoms worsen or fail to improve   Follow Up Instructions: I discussed the assessment and treatment plan  with the patient. The patient was provided an opportunity to ask questions and all were answered. The patient agreed with the plan and demonstrated an understanding of the instructions.  A copy of instructions were sent to the patient via MyChart unless otherwise noted below.    The patient was advised to call back or seek an in-person evaluation if the symptoms worsen or if the condition fails to improve as anticipated.    Delon [name], PA-C

## 2024-07-16 NOTE — Patient Instructions (Signed)
 Marilyn Norris, thank you for joining Delon Marilyn Dickinson, PA-C for today's virtual visit.  While this provider is not your primary care provider (PCP), if your PCP is located in our provider database this encounter information will be shared with them immediately following your visit.   A [facility] MyChart account gives you access to today's visit and all your visits, tests, and labs performed at [facility]  click here if you don't have a [facility] MyChart account or go to [email or web address]  Consent: (Patient) Marilyn Norris provided verbal consent for this virtual visit at the beginning of the encounter.  Current Medications:  Current Outpatient Medications:    amoxicillin-clavulanate (AUGMENTIN) 875-125 MG tablet, Take 1 tablet by mouth 2 (two) times daily., Disp: 14 tablet, Rfl: 0   moxifloxacin (VIGAMOX) 0.5 % ophthalmic solution, Place 1 drop into both eyes 3 (three) times daily for 5 days., Disp: 3 mL, Rfl: 0   doxycycline  (PERIOSTAT ) 20 MG tablet, TAKE 1 TABLET BY MOUTH TWICE A DAY, Disp: 180 tablet, Rfl: 1   fluconazole  (DIFLUCAN ) 150 MG tablet, Take 1 tablet (150 mg total) by mouth once a week., Disp: 4 tablet, Rfl: 0   hydrocortisone 2.5 % cream, Apply topically., Disp: , Rfl:    Levonorgestrel  (KYLEENA ) 19.5 MG IUD, 1 Device by Intrauterine route once for 1 dose., Disp: 1 Intra Uterine Device, Rfl: 0   lisdexamfetamine (VYVANSE ) 40 MG capsule, Take 1 capsule (40 mg total) by mouth every morning., Disp: 90 capsule, Rfl: 0   mometasone  (ELOCON ) 0.1 % cream, Apply 1 Application topically daily as needed (Rash)., Disp: 45 g, Rfl: 3   Medications ordered in this encounter:  Meds ordered this encounter  Medications   amoxicillin-clavulanate (AUGMENTIN) 875-125 MG tablet    Sig: Take 1 tablet by mouth 2 (two) times daily.    Dispense:  14 tablet    Refill:  0    Supervising Provider:   BLAISE ALEENE KIDD [[id number]]   moxifloxacin (VIGAMOX) 0.5 %  ophthalmic solution    Sig: Place 1 drop into both eyes 3 (three) times daily for 5 days.    Dispense:  3 mL    Refill:  0    Supervising Provider:   BLAISE ALEENE KIDD [[id number]]     *If you need refills on other medications prior to your next appointment, please contact your pharmacy*  Follow-Up: Call back or seek an in-person evaluation if the symptoms worsen or if the condition fails to improve as anticipated.  [facility] [phone]  Other Instructions Upper Respiratory Infection, Adult An upper respiratory infection (URI) is a common viral infection of the nose, throat, and upper air passages that lead to the lungs. The most common type of URI is the common cold. URIs usually get better on their own, without medical treatment. What are the causes? A URI is caused by a virus. You may catch a virus by: Breathing in droplets from an infected person's cough or sneeze. Touching something that has been exposed to the virus (is contaminated) and then touching your mouth, nose, or eyes. What increases the risk? You are more likely to get a URI if: You are very young or very old. You have close contact with others, such as at work, school, or a health care facility. You smoke. You have long-term (chronic) heart or lung disease. You have a weakened disease-fighting system (immune system). You have nasal allergies or asthma. You are experiencing a lot of  stress. You have poor nutrition. What are the signs or symptoms? A URI usually involves some of the following symptoms: Runny or stuffy (congested) nose. Cough. Sneezing. Sore throat. Headache. Fatigue. Fever. Loss of appetite. Pain in your forehead, behind your eyes, and over your cheekbones (sinus pain). Muscle aches. Redness or irritation of the eyes. Pressure in the ears or face. How is this diagnosed? This condition may be diagnosed based on your medical history and symptoms, and a physical exam. Your  health care provider may use a swab to take a mucus sample from your nose (nasal swab). This sample can be tested to determine what virus is causing the illness. How is this treated? URIs usually get better on their own within 7-10 days. Medicines cannot cure URIs, but your health care provider may recommend certain medicines to help relieve symptoms, such as: Over-the-counter cold medicines. Cough suppressants. Coughing is a type of defense against infection that helps to clear the respiratory system, so take these medicines only as recommended by your health care provider. Fever-reducing medicines. Follow these instructions at home: Activity Rest as needed. If you have a fever, stay home from work or school until your fever is gone or until your health care provider says your URI cannot spread to other people (is no longer contagious). Your health care provider may have you wear a face mask to prevent your infection from spreading. Relieving symptoms Gargle with a mixture of salt and water 3-4 times a day or as needed. To make salt water, completely dissolve -1 tsp (3-6 g) of salt in 1 cup (237 mL) of warm water. Use a cool-mist humidifier to add moisture to the air. This can help you breathe more easily. Eating and drinking  Drink enough fluid to keep your urine pale yellow. Eat soups and other clear broths. General instructions  Take over-the-counter and prescription medicines only as told by your health care provider. These include cold medicines, fever reducers, and cough suppressants. Do not use any products that contain nicotine or tobacco. These products include cigarettes, chewing tobacco, and vaping devices, such as e-cigarettes. If you need help quitting, ask your health care provider. Stay away from secondhand smoke. Stay up to date on all immunizations, including the yearly (annual) flu vaccine. Keep all follow-up visits. This is important. How to prevent the spread of  infection to others URIs can be contagious. To prevent the infection from spreading: Wash your hands with soap and water for at least 20 seconds. If soap and water are not available, use hand sanitizer. Avoid touching your mouth, face, eyes, or nose. Cough or sneeze into a tissue or your sleeve or elbow instead of into your hand or into the air.  Contact a health care provider if: You are getting worse instead of better. You have a fever or chills. Your mucus is brown or red. You have yellow or brown discharge coming from your nose. You have pain in your face, especially when you bend forward. You have swollen neck glands. You have pain while swallowing. You have white areas in the back of your throat. Get help right away if: You have shortness of breath that gets worse. You have severe or persistent: Headache. Ear pain. Sinus pain. Chest pain. You have chronic lung disease along with any of the following: Making high-pitched whistling sounds when you breathe, most often when you breathe out (wheezing). Prolonged cough (more than 14 days). Coughing up blood. A change in your usual mucus. You have  a stiff neck. You have changes in your: Vision. Hearing. Thinking. Mood. These symptoms may be an emergency. Get help right away. Call 911. Do not wait to see if the symptoms will go away. Do not drive yourself to the hospital. Summary An upper respiratory infection (URI) is a common infection of the nose, throat, and upper air passages that lead to the lungs. A URI is caused by a virus. URIs usually get better on their own within 7-10 days. Medicines cannot cure URIs, but your health care provider may recommend certain medicines to help relieve symptoms. This information is not intended to replace advice given to you by your health care provider. Make sure you discuss any questions you have with your health care provider. Document Revised: 04/08/2021 Document Reviewed:  04/08/2021 Elsevier Patient Education  2024 Elsevier Inc.   If you have been instructed to have an in-person evaluation today at a local Urgent Care facility, please use the link below. It will take you to a list of all of our available [facility], including address, phone number and hours of operation. Please do not delay care.  [facility]  If you or a family member do not have a primary care provider, use the link below to schedule a visit and establish care. When you choose a [facility] primary care physician or advanced practice provider, you gain a long-term partner in health. Find a Primary Care Provider  Learn more about [facility]'s in-office and virtual care options:  - Get Care Now

## 2024-08-01 ENCOUNTER — Ambulatory Visit: Payer: BC Managed Care – PPO | Admitting: Dermatology

## 2024-08-29 ENCOUNTER — Telehealth: Payer: Self-pay | Admitting: Psychiatry

## 2024-08-29 NOTE — Telephone Encounter (Signed)
 Attempted to contact patient to discuss her concerns about medication, had to leave a voicemail.  I have sent a MyChart message as well.

## 2024-09-17 ENCOUNTER — Ambulatory Visit: Admitting: Psychiatry

## 2024-09-17 ENCOUNTER — Other Ambulatory Visit: Payer: Self-pay

## 2024-09-17 ENCOUNTER — Encounter: Payer: Self-pay | Admitting: Psychiatry

## 2024-09-17 VITALS — BP 102/64 | HR 94 | Temp 97.8°F | Ht 64.0 in | Wt 158.0 lb

## 2024-09-17 DIAGNOSIS — F900 Attention-deficit hyperactivity disorder, predominantly inattentive type: Secondary | ICD-10-CM

## 2024-09-17 DIAGNOSIS — Z8659 Personal history of other mental and behavioral disorders: Secondary | ICD-10-CM

## 2024-09-17 MED ORDER — DEXTROAMPHETAMINE SULFATE ER 10 MG PO CP24: 10.0000 mg | ORAL_CAPSULE | Freq: Every day | ORAL | 0 refills | Status: DC

## 2024-09-17 NOTE — Patient Instructions (Signed)
 Dextroamphetamine Tablets What is this medication? DEXTROAMPHETAMINE (dex troe am FET a meen) treats attention-deficit hyperactivity disorder (ADHD). It works by improving focus and reducing impulsive behavior. It may also be used to treat narcolepsy. It works by promoting wakefulness. It belongs to a group of medications called stimulants. This medicine may be used for other purposes; ask your health care provider or pharmacist if you have questions. COMMON BRAND NAME(S): Dexedrine, DextroStat, Zenzedi What should I tell my care team before I take this medication? They need to know if you have any of these conditions: Anxiety or panic attacks Circulation problems in fingers and toes (Raynaud disease) Glaucoma Heart attack Heart disease High blood pressure History of substance use disorder Kidney disease Liver disease Mental health conditions Seizures Stroke Suicidal thoughts, plans, or attempt by you or a family member Thyroid disease Tourette syndrome An unusual or allergic reaction to dextroamphetamine, other amphetamines, other medications, foods, dyes, or preservatives Pregnant or trying to get pregnant Breast-feeding How should I use this medication? Take this medication by mouth with water. Take it as directed on the prescription label. You can take it with or without food. If it upsets your stomach, take it with food. Keep taking it unless your care team tells you to stop. A special MedGuide will be given to you by the pharmacist with each prescription and refill. Be sure to read this information carefully each time. Talk to your care team about the use of this medication in children. While it may be prescribed for children as young as 3 years for selected conditions, precautions do apply. Overdosage: If you think you have taken too much of this medicine contact a poison control center or emergency room at once. NOTE: This medicine is only for you. Do not share this medicine  with others. What if I miss a dose? If you miss a dose, take it as soon as you can. If it is almost time for your next dose, take only that dose. Do not take double or extra doses. What may interact with this medication? Do not take this medication with any of the following: Linezolid MAOIs, such as Marplan, Nardil, and Parnate Methylene blue This medication may also interact with the following: Acetazolamide Alcohol Ascorbic acid Certain medications for depression, anxiety, or other mental health conditions Certain medications for migraines, such as sumatriptan Guanethidine Opioids Reserpine Sodium bicarbonate St. John's wort Thiazide diuretics, such as chlorothiazide Tryptophan This list may not describe all possible interactions. Give your health care provider a list of all the medicines, herbs, non-prescription drugs, or dietary supplements you use. Also tell them if you smoke, drink alcohol, or use illegal drugs. Some items may interact with your medicine. What should I watch for while using this medication? Visit your care team for regular checks on your progress. Tell your care team if your symptoms do not start to get better or if they get worse. This medication requires a new prescription from your care team every time it is filled at the pharmacy. This medication can be abused and cause your brain and body to depend on it after high doses or long term use. Your care team will assess your risk and monitor you closely during treatment. Long term use of this medication may cause your brain and body to depend on it. You may be able to take breaks from this medication during weekends, holidays, or summer vacations. Talk to your care team about what works for you. If your care team wants  you to stop this medication permanently, the dose may be slowly lowered over time to reduce the risk of side effects. Tell your care team if this medication loses its effects, or if you feel you need  to take more than the prescribed amount. Do not change your dose without talking to your care team. Do not take this medication close to bedtime. It may prevent you from sleeping. Loss of appetite is common when starting this medication. Eating small, frequent meals or snacks can help. Talk to your care team if appetite loss persists. Children should have height and weight checked often while taking this medication. Tell your care team right away if you notice unexplained wounds on your fingers and toes while taking this medication. You should also tell your care team if you experience numbness or pain, changes in the skin color, or sensitivity to temperature in your fingers or toes. Contact your care team right away if you have an erection that lasts longer than 4 hours or if it becomes painful. This may be a sign of a serious problem and must be treated right away to prevent permanent damage. What side effects may I notice from receiving this medication? Side effects that you should report to your care team as soon as possible: Allergic reactions--skin rash, itching, hives, swelling of the face, lips, tongue, or throat Heart attack--pain or tightness in the chest, shoulders, arms, or jaw, nausea, shortness of breath, cold or clammy skin, feeling faint or lightheaded Heart rhythm changes--fast or irregular heartbeat, dizziness, feeling faint or lightheaded, chest pain, trouble breathing Increase in blood pressure Irritability, confusion, fast or irregular heartbeat, muscle stiffness, twitching muscles, sweating, high fever, seizure, chills, vomiting, diarrhea, which may be signs of serotonin syndrome Mood and behavior changes--anxiety, nervousness, confusion, hallucinations, irritability, hostility, thoughts of suicide or self-harm, worsening mood, feelings of depression Prolonged or painful erection Raynaud syndrome--cool, numb, or painful fingers or toes that may change color from pale, to blue, to  red Seizures Stroke--sudden numbness or weakness of the face, arm, or leg, trouble speaking, confusion, trouble walking, loss of balance or coordination, dizziness, severe headache, change in vision Side effects that usually do not require medical attention (report these to your care team if they continue or are bothersome): Dry mouth Headache Loss of appetite with weight loss Nausea Stomach pain Trouble sleeping This list may not describe all possible side effects. Call your doctor for medical advice about side effects. You may report side effects to FDA at 1-800-FDA-1088. Where should I keep my medication? Keep out of the reach of children and pets. This medication can be abused. Keep it in a safe place to protect it from theft. Do not share it with anyone. It is only for you. Selling or giving away this medication is dangerous and against the law. Store at room temperature between 20 and 25 degrees C (68 and 77 degrees F). Protect from light and moisture. Keep container tightly closed. Get rid of any unused medication after the expiration date. This medication may cause harm and death if it is taken by other adults, children, or pets. It is important to get rid of the medication as soon as you no longer need it or it is expired. You can do this in two ways: Take the medication to a medication take-back program. Check with your pharmacy or law enforcement to find a location. If you cannot return the medication, check the label or package insert to see if the medication should  be thrown out in the garbage or flushed down the toilet. If you are not sure, ask your care team. If it is safe to put it in the trash, take the medication out of the container. Mix the medication with cat litter, dirt, coffee grounds, or other unwanted substance. Seal the mixture in a bag or container. Put it in the trash. NOTE: This sheet is a summary. It may not cover all possible information. If you have questions about  this medicine, talk to your doctor, pharmacist, or health care provider.  2024 Elsevier/Gold Standard (2023-08-19 00:00:00)

## 2024-09-17 NOTE — Progress Notes (Unsigned)
 BH MD OP Progress Note  09/17/2024 11:35 AM Marilyn Norris  MRN:  [id number]  Chief Complaint:  Chief Complaint  Patient presents with   Follow-up   Anxiety   ADD   Discussed the use of AI scribe software for clinical note transcription with the patient, who gave verbal consent to proceed.  History of Present Illness Marilyn Norris is a 43 year old Caucasian female, employed, married, lives in [place], has a history of ADHD inattentive type, depression was evaluated in office today for a follow-up appointment.  Ongoing challenges managing ADHD symptoms continue, and she describes she will not be able to afford the Vyvanse  again due to planned requirement change this coming new year.  That is frustrating for her and she would like to try something else to replace the Vyvanse .  She may have tried Concerta  previously and it stopped working.  She is agreeable to trial of Dexedrine low dosage and gradually going up on the dosage as needed.  She otherwise denies any significant anxiety or depression symptoms.  She reports sleep is good.  She reports appetite is fair.  She denies any thoughts of hurting herself or others.   She is planning to return to work on January 2.  Denies any other concerns today.   Visit Diagnosis:    ICD-10-CM   1. Attention deficit hyperactivity disorder (ADHD), predominantly inattentive type  F90.0 dextroamphetamine (DEXEDRINE SPANSULE) 10 MG 24 hr capsule    2. History of depression  Z86.59       Past Psychiatric History: I have reviewed past psychiatric history from progress note on 06/26/2020.  Past trials of Adderall, Concerta , Mydayis , Ritalin , Vyvanse   Past Medical History:  Past Medical History:  Diagnosis Date   Adult ADHD    ADD   Dysplastic nevus 04/12/2018   R inf breast - mild   Dysplastic nevus 04/12/2018   L mid to upper back 4.0 cm lat to spine - severe, excision 11/14/2018   Dysplastic nevus 01/11/2019   R med sup pubic  area - mild    Past Surgical History:  Procedure Laterality Date   CESAREAN SECTION  07/03/2009   HERNIA REPAIR     TONSILLECTOMY AND ADENOIDECTOMY     [age] years old     Family Psychiatric History: I have reviewed family psychiatric history from progress note on 06/26/2020.  Family History:  Family History  Problem Relation Age of Onset   Thyroid disease Mother    Cancer Father        Kidney    Thyroid disease Sister    Bipolar disorder Maternal Uncle    ADD / ADHD Daughter    Breast cancer Neg Hx     Social History: I have reviewed social history from progress note on 06/26/2020. Social History   Socioeconomic History   Marital status: Married    Spouse name: Not on file   Number of children: 3   Years of education: Not on file   Highest education level: Some college, no degree  Occupational History   Not on file  Tobacco Use   Smoking status: Never   Smokeless tobacco: Never  Vaping Use   Vaping status: Never Used  Substance and Sexual Activity   Alcohol use: Yes    Comment: SOCIAL   Drug use: No   Sexual activity: Yes    Birth control/protection: I.U.D.  Other Topics Concern   Not on file  Social History Narrative   Not on file  Social Drivers of Health   Tobacco Use: Low Risk (09/17/2024)   Patient History    Smoking Tobacco Use: Never    Smokeless Tobacco Use: Never    Passive Exposure: Not on file  Financial Resource Strain: Low Risk  (12/20/2022)   Received from [facility]   Overall Financial Resource Strain (CARDIA)    Difficulty of Paying Living Expenses: Not hard at all  Food Insecurity: No Food Insecurity (12/20/2022)   Received from [facility]   Epic    Within the past 12 months, you worried that your food would run out before you got the money to buy more.: Never true    Within the past 12 months, the food you bought just didn't last and you didn't have money to get more.: Never true  Transportation  Needs: No Transportation Needs (12/20/2022)   Received from [facility] - Transportation    In the past 12 months, has lack of transportation kept you from medical appointments or from getting medications?: No    Lack of Transportation (Non-Medical): No  Physical Activity: Insufficiently Active (12/20/2022)   Received from [facility]   Exercise Vital Sign    On average, how many days per week do you engage in moderate to strenuous exercise (like a brisk walk)?: 2 days    On average, how many minutes do you engage in exercise at this level?: 20 min  Stress: No Stress Concern Present (12/20/2022)   Received from [facility] - Occupational Stress Questionnaire    Feeling of Stress : Not at all  Social Connections: Socially Integrated (12/20/2022)   Received from [facility]   Social Connection and Isolation Panel    In a typical week, how many times do you talk on the phone with family, friends, or neighbors?: More than three times a week    How often do you get together with friends or relatives?: Once a week    How often do you attend church or religious services?: More than 4 times per year    Do you belong to any clubs or organizations such as church groups, unions, fraternal or athletic groups, or school groups?: Yes    How often do you attend meetings of the clubs or organizations you belong to?: More than 4 times per year    Are you married, widowed, divorced, separated, never married, or living with a partner?: Married  Depression (PHQ2-9): Low Risk (09/17/2024)   Depression (PHQ2-9)    PHQ-2 Score: 0  Alcohol Screen: Not on file  Housing: Not on file  Utilities: Not At Risk (12/20/2022)   Received from [facility]    Threatened with loss of utilities: No  Health Literacy: Not on file    Allergies: Allergies[1]  Metabolic  Disorder Labs: No results found for: HGBA1C, MPG No results found for: PROLACTIN No results found for: CHOL, TRIG, HDL, CHOLHDL, VLDL, LDLCALC Lab Results  Component Value Date   TSH 1.070 09/12/2017    Therapeutic Level Labs: No results found for: LITHIUM No results found for: VALPROATE No results found for: CBMZ  Current Medications: Current Outpatient Medications  Medication Sig Dispense Refill   amoxicillin -clavulanate (AUGMENTIN ) 875-125 MG tablet Take 1 tablet by mouth 2 (two) times daily. 14 tablet 0   dextroamphetamine (DEXEDRINE SPANSULE) 10 MG 24 hr capsule Take 1  capsule (10 mg total) by mouth daily. 21 capsule 0   doxycycline  (PERIOSTAT ) 20 MG tablet TAKE 1 TABLET BY MOUTH TWICE A DAY 180 tablet 1   fluconazole  (DIFLUCAN ) 150 MG tablet Take 1 tablet (150 mg total) by mouth once a week. 4 tablet 0   hydrocortisone 2.5 % cream Apply topically.     mometasone  (ELOCON ) 0.1 % cream Apply 1 Application topically daily as needed (Rash). 45 g 3   Levonorgestrel  (KYLEENA ) 19.5 MG IUD 1 Device by Intrauterine route once for 1 dose. 1 Intra Uterine Device 0   No current facility-administered medications for this visit.     Musculoskeletal: Strength & Muscle Tone: within normal limits Gait & Station: normal Patient leans: N/A  Psychiatric Specialty Exam: Review of Systems  Psychiatric/Behavioral: Negative.      Blood pressure 102/64, pulse 94, temperature 97.8 F (36.6 C), temperature source Temporal, height 5' 4 (1.626 m), weight 158 lb (71.7 kg).Body mass index is 27.12 kg/m.  General Appearance: Casual  Eye Contact:  Fair  Speech:  Clear and Coherent  Volume:  Normal  Mood:  Euthymic  Affect:  Appropriate  Thought Process:  Goal Directed and Descriptions of Associations: Intact  Orientation:  Full (Time, Place, and Person)  Thought Content: Logical   Suicidal Thoughts:  No  Homicidal Thoughts:  No  Memory:  Immediate;   Fair Recent;    Fair Remote;   Fair  Judgement:  Fair  Insight:  Fair  Psychomotor Activity:  Normal  Concentration:  Concentration: Fair and Attention Span: Fair  Recall:  [employer] of Knowledge: Fair  Language: Fair  Akathisia:  No  Handed:  Right  AIMS (if indicated): not done  Assets:  Communication Skills Desire for Improvement Housing Social Support Talents/Skills  ADL's:  Intact  Cognition: WNL  Sleep:  Fair   Screenings: GAD-7    Garment/textile Technologist Visit from 09/17/2024 in [facility] Office Visit from 01/25/2024 in [facility]  Total GAD-7 Score 7 2   PHQ2-9    Flowsheet Row Office Visit from 09/17/2024 in [facility] Office Visit from 03/28/2024 in [facility] Office Visit from 01/25/2024 in [facility] Office Visit from 07/26/2023 in [facility] Video Visit from 10/21/2022 in [facility]  PHQ-2 Total Score 0 0 0 0 0   Flowsheet Row Office Visit from 09/17/2024 in [facility] Video Visit from 06/18/2024 in [facility] Office Visit from 03/28/2024 in [facility]  C-SSRS RISK CATEGORY No Risk No Risk No Risk     Assessment and Plan: Suzzette Gasparro is a 43 year old Caucasian female who presented for a follow-up appointment, discussed assessment and plan as noted below.  1. Attention deficit hyperactivity disorder (ADHD), predominantly inattentive type-unstable Currently unable to afford Vyvanse  due to cost.  Discussed multiple stimulant options including Adderall extended release, Ritalin , Focalin, Jornay, Azstarys,agreeable to trial of Dexedrine. Start Dexedrine 10 mg daily Provided  medication education. Reviewed [place] PMP AWARxE   2. History of depression Currently denies any concerns.  Follow-up Follow-up in clinic in 3 weeks or sooner if needed.    Consent: Patient/Guardian gives verbal consent for treatment and assignment of benefits for services provided during this visit. Patient/Guardian expressed understanding and agreed to proceed.  This note was generated in part or whole with voice recognition software. Voice recognition is usually quite accurate but there are transcription errors that can and very often do occur. I apologize for any typographical errors that were not detected and corrected. I have spent atleast 30 minutes face to face with patient today which includes the time spent for preparing to see the patient ( e.g., review of test, records ), ordering medications  ,psychoeducation and supportive psychotherapy and care coordination,as well as documenting clinical information in electronic health record.    Tauna Macfarlane, MD 09/17/2024, 11:35 AM     [1]  Allergies Allergen Reactions   Morphine Itching   Morphine And Codeine Itching

## 2024-09-18 ENCOUNTER — Telehealth: Payer: Self-pay

## 2024-09-18 NOTE — Telephone Encounter (Signed)
 Prior authorization submitted of the patients Dextroamphetamine Sulfate ER 10 mg capsules via CoverMyMeds sent to [facility]    Approved  Coverage 09-18-24///09-19-27

## 2024-10-02 DIAGNOSIS — F900 Attention-deficit hyperactivity disorder, predominantly inattentive type: Secondary | ICD-10-CM

## 2024-10-03 MED ORDER — DEXTROAMPHETAMINE SULFATE ER 10 MG PO CP24: 20.0000 mg | ORAL_CAPSULE | Freq: Every day | ORAL | 0 refills | Status: DC

## 2024-10-10 ENCOUNTER — Encounter: Payer: Self-pay | Admitting: Psychiatry

## 2024-10-10 ENCOUNTER — Telehealth: Admitting: Psychiatry

## 2024-10-10 DIAGNOSIS — Z8659 Personal history of other mental and behavioral disorders: Secondary | ICD-10-CM

## 2024-10-10 DIAGNOSIS — G4700 Insomnia, unspecified: Secondary | ICD-10-CM | POA: Diagnosis not present

## 2024-10-10 DIAGNOSIS — F900 Attention-deficit hyperactivity disorder, predominantly inattentive type: Secondary | ICD-10-CM

## 2024-10-10 NOTE — Progress Notes (Unsigned)
 Virtual Visit via Video Note  I connected with Marilyn Norris on 10/10/24 at  4:20 PM EST by a video enabled telemedicine application and verified that I am speaking with the correct person using two identifiers.  Location Provider Location : [facility] Patient Location : Work  Participants: Patient , Provider   I discussed the limitations of evaluation and management by telemedicine and the availability of in person appointments. The patient expressed understanding and agreed to proceed.   I discussed the assessment and treatment plan with the patient. The patient was provided an opportunity to ask questions and all were answered. The patient agreed with the plan and demonstrated an understanding of the instructions.   The patient was advised to call back or seek an in-person evaluation if the symptoms worsen or if the condition fails to improve as anticipated.  BH MD OP Progress Note  10/10/2024 4:33 PM Marilyn Norris  MRN:  [id number]  Chief Complaint:  Chief Complaint  Patient presents with   Follow-up   ADD   Anxiety   Medication Refill   Discussed the use of AI scribe software for clinical note transcription with the patient, who gave verbal consent to proceed.  History of Present Illness Marilyn Norris is a 44 year old Caucasian female, employed, married, lives in [place], has a history of ADHD inattentive type, depression was evaluated by telemedicine today.  Work demands, preparation for a winter storm, upcoming remote learning days, and planning for her daughter's cheer competition contribute to her feeling busy and frazzled. She reports that these stressors have increased her workload and require her to plan further ahead than usual.  She recently picked up a higher dose of her medication, dexedrine  10 mg twice daily, and took the first dose today without experiencing any side effects. She notes that with her previous dose, she experienced an early crash in the  day, sometimes feeling the need to go to bed as early as 4 PM, which she found unusual. She discusses the possibility of adjusting the timing of her doses, such as spacing them out by 4 or 5 hours, to avoid this crash.  She generally finds her sleep okay, with a pattern of alternating between deep and lighter sleep depending on factors such as how late she eats and her daily activities. She identifies this sleep pattern as part of her usual lifestyle.  She denies any suicidality, homicidality or perceptual disturbances.    Visit Diagnosis:    ICD-10-CM   1. Attention deficit hyperactivity disorder (ADHD), predominantly inattentive type  F90.0     2. Insomnia, unspecified type  G47.00     3. History of depression  Z86.59       Past Psychiatric History: I have reviewed past psychiatric history from progress note on 06/26/2020.  Past trials of Adderall, Concerta , Mydayis , Ritalin , Vyvanse   Past Medical History:  Past Medical History:  Diagnosis Date   Adult ADHD    ADD   Dysplastic nevus 04/12/2018   R inf breast - mild   Dysplastic nevus 04/12/2018   L mid to upper back 4.0 cm lat to spine - severe, excision 11/14/2018   Dysplastic nevus 01/11/2019   R med sup pubic area - mild    Past Surgical History:  Procedure Laterality Date   CESAREAN SECTION  07/03/2009   HERNIA REPAIR     TONSILLECTOMY AND ADENOIDECTOMY     51-61 years old     Family Psychiatric History: I have reviewed family psychiatric  history from progress note on 06/26/2020.  Family History:  Family History  Problem Relation Age of Onset   Thyroid disease Mother    Cancer Father        Kidney    Thyroid disease Sister    Bipolar disorder Maternal Uncle    ADD / ADHD Daughter    Breast cancer Neg Hx     Social History: I have reviewed social history from progress note on 06/26/2020. Social History   Socioeconomic History   Marital status: Married    Spouse name: Not on file   Number of children: 3    Years of education: Not on file   Highest education level: Some college, no degree  Occupational History   Not on file  Tobacco Use   Smoking status: Never   Smokeless tobacco: Never  Vaping Use   Vaping status: Never Used  Substance and Sexual Activity   Alcohol use: Yes    Comment: SOCIAL   Drug use: No   Sexual activity: Yes    Birth control/protection: I.U.D.  Other Topics Concern   Not on file  Social History Narrative   Not on file   Social Drivers of Health   Tobacco Use: Low Risk (10/10/2024)   Patient History    Smoking Tobacco Use: Never    Smokeless Tobacco Use: Never    Passive Exposure: Not on file  Financial Resource Strain: Low Risk  (12/20/2022)   Received from [facility]   Overall Financial Resource Strain (CARDIA)    Difficulty of Paying Living Expenses: Not hard at all  Food Insecurity: No Food Insecurity (12/20/2022)   Received from [facility]   Epic    Within the past 12 months, you worried that your food would run out before you got the money to buy more.: Never true    Within the past 12 months, the food you bought just didn't last and you didn't have money to get more.: Never true  Transportation Needs: No Transportation Needs (12/20/2022)   Received from [facility] - Transportation    In the past 12 months, has lack of transportation kept you from medical appointments or from getting medications?: No    Lack of Transportation (Non-Medical): No  Physical Activity: Insufficiently Active (12/20/2022)   Received from [facility]   Exercise Vital Sign    On average, how many days per week do you engage in moderate to strenuous exercise (like a brisk walk)?: 2 days    On average, how many minutes do you engage in exercise at this level?: 20 min  Stress: No Stress Concern Present (12/20/2022)   Received from [facility]  [facility] - Occupational Stress Questionnaire    Feeling of Stress : Not at all  Social Connections: Socially Integrated (12/20/2022)   Received from [facility]   Social Connection and Isolation Panel    In a typical week, how many times do you talk on the phone with family, friends, or neighbors?: More than three times a week    How often do you get together with friends or relatives?: Once a week    How often do you attend church or religious services?: More than 4 times per year    Do you belong to any clubs or organizations such as church groups, unions, fraternal or athletic groups, or school groups?: Yes  How often do you attend meetings of the clubs or organizations you belong to?: More than 4 times per year    Are you married, widowed, divorced, separated, never married, or living with a partner?: Married  Depression (PHQ2-9): Low Risk (09/17/2024)   Depression (PHQ2-9)    PHQ-2 Score: 0  Alcohol Screen: Not on file  Housing: Not on file  Utilities: Not At Risk (12/20/2022)   Received from [facility] Utilities    Threatened with loss of utilities: No  Health Literacy: Not on file    Allergies: Allergies[1]  Metabolic Disorder Labs: No results found for: HGBA1C, MPG No results found for: PROLACTIN No results found for: CHOL, TRIG, HDL, CHOLHDL, VLDL, LDLCALC Lab Results  Component Value Date   TSH 1.070 09/12/2017    Therapeutic Level Labs: No results found for: LITHIUM No results found for: VALPROATE No results found for: CBMZ  Current Medications: Current Outpatient Medications  Medication Sig Dispense Refill   amoxicillin -clavulanate (AUGMENTIN ) 875-125 MG tablet Take 1 tablet by mouth 2 (two) times daily. 14 tablet 0   dextroamphetamine  (DEXEDRINE  SPANSULE) 10 MG 24 hr capsule Take 2 capsules (20 mg total) by mouth daily. 28 capsule 0   doxycycline  (PERIOSTAT ) 20 MG tablet TAKE 1 TABLET BY MOUTH  TWICE A DAY 180 tablet 1   fluconazole  (DIFLUCAN ) 150 MG tablet Take 1 tablet (150 mg total) by mouth once a week. 4 tablet 0   hydrocortisone 2.5 % cream Apply topically.     Levonorgestrel  (KYLEENA ) 19.5 MG IUD 1 Device by Intrauterine route once for 1 dose. 1 Intra Uterine Device 0   mometasone  (ELOCON ) 0.1 % cream Apply 1 Application topically daily as needed (Rash). 45 g 3   No current facility-administered medications for this visit.     Musculoskeletal: Strength & Muscle Tone: UTA Gait & Station: Seated Patient leans: N/A  Psychiatric Specialty Exam: Review of Systems  Psychiatric/Behavioral:  Positive for decreased concentration and sleep disturbance.     There were no vitals taken for this visit.There is no height or weight on file to calculate BMI.  General Appearance: Casual  Eye Contact:  Fair  Speech:  Clear and Coherent  Volume:  Normal  Mood:  Euthymic  Affect:  Congruent  Thought Process:  Goal Directed and Descriptions of Associations: Intact  Orientation:  Full (Time, Place, and Person)  Thought Content: Logical   Suicidal Thoughts:  No  Homicidal Thoughts:  No  Memory:  Immediate;   Fair Recent;   Fair Remote;   Fair  Judgement:  Fair  Insight:  Fair  Psychomotor Activity:  Normal  Concentration:  Concentration: Fair and Attention Span: Fair  Recall:  [employer] of Knowledge: Fair  Language: Fair  Akathisia:  No  Handed:  Right  AIMS (if indicated): not done  Assets:  Communication Skills Desire for Improvement Housing Resilience Social Support Talents/Skills Transportation  ADL's:  Intact  Cognition: WNL  Sleep:  Varies   Screenings: GAD-7    Garment/textile Technologist Visit from 09/17/2024 in [facility] Office Visit from 01/25/2024 in [facility]  Total GAD-7 Score 7 2   PHQ2-9    Flowsheet Row Office Visit from 09/17/2024 in [facility] Office Visit from 03/28/2024 in [facility] Office Visit from 01/25/2024 in [facility] Office Visit from 07/26/2023 in [facility] Video Visit from 10/21/2022 in [facility]  PHQ-2 Total Score 0 0 0 0 0   Flowsheet Row Video Visit from 10/10/2024 in [facility] Office Visit from 09/17/2024 in [facility] Video Visit from 06/18/2024 in [facility]  C-SSRS RISK CATEGORY No Risk No Risk No Risk     Assessment and Plan: Nekeya Briski is a 44 year old Caucasian female who presented for a follow-up appointment, discussed assessment and plan as noted below.  1. Attention deficit hyperactivity disorder (ADHD), predominantly inattentive type-unstable Currently reports some improvement on the Dexedrine  however reports a crash in the afternoon hence dosage increased recently and she took her first dose today. Continue Dexedrine  20 mg daily. Reviewed [place] PMP AWARxE   2.  Insomnia unspecified-unstable Does report restless sleep and reports she has been struggling with this for a very long time. Continue sleep hygiene techniques.  Will consider addition of a sleep medication in the future as needed.   3. History of depression Currently denies any depression symptoms other than sleep as noted above.  Follow-up Follow-up in clinic in 1 month or sooner if needed.   Consent: Patient/Guardian gives verbal consent for treatment and assignment of benefits for services provided during this visit. Patient/Guardian expressed understanding and agreed to proceed.  This note was generated in part or whole with voice recognition software. Voice recognition is usually quite accurate but there are transcription  errors that can and very often do occur. I apologize for any typographical errors that were not detected and corrected.     Marilyn Creely, MD 10/11/2024, 2:01 PM     [1]  Allergies Allergen Reactions   Morphine Itching   Morphine And Codeine Itching

## 2024-10-19 MED ORDER — DEXTROAMPHETAMINE SULFATE ER 10 MG PO CP24: 20.0000 mg | ORAL_CAPSULE | Freq: Every day | ORAL | 0 refills | Status: AC

## 2024-10-19 NOTE — Addendum Note (Signed)
 Addended byBETHA COBY HEIGHT on: 10/19/2024 08:18 AM   Modules accepted: Orders

## 2024-11-15 ENCOUNTER — Telehealth: Admitting: Psychiatry

## 2025-04-10 ENCOUNTER — Ambulatory Visit: Admitting: Dermatology
